# Patient Record
Sex: Female | Born: 1960 | Race: White | Hispanic: No | Smoking: Never smoker
Health system: Southern US, Community
[De-identification: ages and names within clinical notes are randomized; demographics above are authoritative.]

## PROBLEM LIST (undated history)

## (undated) DIAGNOSIS — M1990 Unspecified osteoarthritis, unspecified site: Secondary | ICD-10-CM

## (undated) DIAGNOSIS — N912 Amenorrhea, unspecified: Secondary | ICD-10-CM

## (undated) DIAGNOSIS — Z973 Presence of spectacles and contact lenses: Secondary | ICD-10-CM

## (undated) DIAGNOSIS — U071 COVID-19: Secondary | ICD-10-CM

## (undated) DIAGNOSIS — R87619 Unspecified abnormal cytological findings in specimens from cervix uteri: Secondary | ICD-10-CM

## (undated) DIAGNOSIS — Z87410 Personal history of cervical dysplasia: Secondary | ICD-10-CM

## (undated) DIAGNOSIS — Z87411 Personal history of vaginal dysplasia: Secondary | ICD-10-CM

## (undated) DIAGNOSIS — Z8742 Personal history of other diseases of the female genital tract: Secondary | ICD-10-CM

## (undated) HISTORY — PX: CERVICAL CONE BIOPSY: SUR198

## (undated) HISTORY — DX: Unspecified osteoarthritis, unspecified site: M19.90

## (undated) HISTORY — PX: GUM SURGERY: SHX658

## (undated) HISTORY — DX: Unspecified abnormal cytological findings in specimens from cervix uteri: R87.619

## (undated) HISTORY — DX: Amenorrhea, unspecified: N91.2

## (undated) HISTORY — PX: WISDOM TOOTH EXTRACTION: SHX21

---

## 1997-08-13 ENCOUNTER — Other Ambulatory Visit: Admission: RE | Admit: 1997-08-13 | Discharge: 1997-08-13 | Payer: Self-pay | Admitting: Obstetrics and Gynecology

## 1998-06-29 ENCOUNTER — Other Ambulatory Visit: Admission: RE | Admit: 1998-06-29 | Discharge: 1998-06-29 | Payer: Self-pay | Admitting: Obstetrics and Gynecology

## 1999-07-26 ENCOUNTER — Other Ambulatory Visit: Admission: RE | Admit: 1999-07-26 | Discharge: 1999-07-26 | Payer: Self-pay | Admitting: Obstetrics and Gynecology

## 2000-07-26 ENCOUNTER — Other Ambulatory Visit: Admission: RE | Admit: 2000-07-26 | Discharge: 2000-07-26 | Payer: Self-pay | Admitting: Obstetrics and Gynecology

## 2001-08-08 ENCOUNTER — Other Ambulatory Visit: Admission: RE | Admit: 2001-08-08 | Discharge: 2001-08-08 | Payer: Self-pay | Admitting: Obstetrics and Gynecology

## 2002-08-19 ENCOUNTER — Other Ambulatory Visit: Admission: RE | Admit: 2002-08-19 | Discharge: 2002-08-19 | Payer: Self-pay | Admitting: Obstetrics and Gynecology

## 2003-09-08 ENCOUNTER — Other Ambulatory Visit: Admission: RE | Admit: 2003-09-08 | Discharge: 2003-09-08 | Payer: Self-pay | Admitting: Obstetrics and Gynecology

## 2004-09-08 ENCOUNTER — Other Ambulatory Visit: Admission: RE | Admit: 2004-09-08 | Discharge: 2004-09-08 | Payer: Self-pay | Admitting: Obstetrics and Gynecology

## 2005-09-11 ENCOUNTER — Other Ambulatory Visit: Admission: RE | Admit: 2005-09-11 | Discharge: 2005-09-11 | Payer: Self-pay | Admitting: Obstetrics and Gynecology

## 2006-10-01 ENCOUNTER — Other Ambulatory Visit: Admission: RE | Admit: 2006-10-01 | Discharge: 2006-10-01 | Payer: Self-pay | Admitting: Obstetrics and Gynecology

## 2007-10-03 ENCOUNTER — Other Ambulatory Visit: Admission: RE | Admit: 2007-10-03 | Discharge: 2007-10-03 | Payer: Self-pay | Admitting: Obstetrics and Gynecology

## 2008-01-27 ENCOUNTER — Encounter: Admission: RE | Admit: 2008-01-27 | Discharge: 2008-01-27 | Payer: Self-pay | Admitting: Obstetrics and Gynecology

## 2009-01-08 DIAGNOSIS — M1990 Unspecified osteoarthritis, unspecified site: Secondary | ICD-10-CM | POA: Insufficient documentation

## 2009-01-27 ENCOUNTER — Encounter: Admission: RE | Admit: 2009-01-27 | Discharge: 2009-01-27 | Payer: Self-pay | Admitting: Obstetrics and Gynecology

## 2010-02-01 ENCOUNTER — Encounter: Admission: RE | Admit: 2010-02-01 | Discharge: 2010-02-01 | Payer: Self-pay | Admitting: Obstetrics and Gynecology

## 2011-01-04 ENCOUNTER — Other Ambulatory Visit: Payer: Self-pay | Admitting: Obstetrics and Gynecology

## 2011-01-04 DIAGNOSIS — Z1231 Encounter for screening mammogram for malignant neoplasm of breast: Secondary | ICD-10-CM

## 2011-02-03 ENCOUNTER — Ambulatory Visit
Admission: RE | Admit: 2011-02-03 | Discharge: 2011-02-03 | Disposition: A | Discharge status: Home / Self Care | Payer: BC Managed Care – PPO | Source: Ambulatory Visit | Attending: Obstetrics and Gynecology | Admitting: Obstetrics and Gynecology

## 2011-02-03 DIAGNOSIS — Z1231 Encounter for screening mammogram for malignant neoplasm of breast: Secondary | ICD-10-CM

## 2011-10-10 LAB — HM PAP SMEAR: HM Pap smear: NEGATIVE

## 2011-12-28 ENCOUNTER — Other Ambulatory Visit: Payer: Self-pay | Admitting: Obstetrics and Gynecology

## 2011-12-28 ENCOUNTER — Other Ambulatory Visit: Payer: Self-pay | Admitting: Obstetrics & Gynecology

## 2011-12-28 DIAGNOSIS — Z1231 Encounter for screening mammogram for malignant neoplasm of breast: Secondary | ICD-10-CM

## 2012-02-05 ENCOUNTER — Ambulatory Visit
Admission: RE | Admit: 2012-02-05 | Discharge: 2012-02-05 | Disposition: A | Discharge status: Home / Self Care | Payer: BC Managed Care – PPO | Source: Ambulatory Visit | Attending: Obstetrics & Gynecology | Admitting: Obstetrics & Gynecology

## 2012-02-05 DIAGNOSIS — Z1231 Encounter for screening mammogram for malignant neoplasm of breast: Secondary | ICD-10-CM

## 2012-10-08 ENCOUNTER — Encounter: Payer: Self-pay | Admitting: *Deleted

## 2012-10-11 ENCOUNTER — Encounter: Payer: Self-pay | Admitting: Nurse Practitioner

## 2012-10-11 ENCOUNTER — Ambulatory Visit (INDEPENDENT_AMBULATORY_CARE_PROVIDER_SITE_OTHER): Payer: PRIVATE HEALTH INSURANCE | Admitting: Nurse Practitioner

## 2012-10-11 VITALS — BP 120/62 | HR 74 | Resp 14 | Ht 69.5 in | Wt 147.2 lb

## 2012-10-11 DIAGNOSIS — Z01419 Encounter for gynecological examination (general) (routine) without abnormal findings: Secondary | ICD-10-CM

## 2012-10-11 MED ORDER — NORETHINDRONE 0.35 MG PO TABS: 1.0000 | ORAL_TABLET | Freq: Every day | ORAL | Status: DC

## 2012-10-11 NOTE — Progress Notes (Signed)
52 y.o. G0P0 Married Caucasian Fe here for annual exam.  Since on Micronor very light to no period. No bloating or PMS.  Patient's last menstrual period was 04/20/2012.          Sexually active: yes  The current method of family planning is POP (progesterone only).    Exercising: yes  aerobics and strength training  Smoker:  no  Health Maintenance: Pap:  Normal with negative HR HPV MMG:  01/2012 normal Colonoscopy:  Never (PCP is arranging for patient) TDaP:  2006 Labs: PCP maintains all labs and urine.    reports that she has never smoked. She has never used smokeless tobacco. She reports that she drinks about 3.5 ounces of alcohol per week. She reports that she does not use illicit drugs.  Past Medical History  Diagnosis Date  . Amenorrhea     History reviewed. No pertinent past surgical history.  Current Outpatient Prescriptions  Medication Sig Dispense Refill  . calcium carbonate 200 MG capsule Take 250 mg by mouth daily.      . Multiple Vitamin (MULTIVITAMIN) tablet Take 1 tablet by mouth daily.      . naproxen sodium (ANAPROX) 220 MG tablet Take 220 mg by mouth 2 (two) times daily with a meal.      . norethindrone (MICRONOR,CAMILA,ERRIN) 0.35 MG tablet Take 1 tablet by mouth daily.       No current facility-administered medications for this visit.    Family History  Problem Relation Age of Onset  . Anemia Mother   . Breast cancer Paternal Grandmother   . Emphysema Paternal Grandfather     ROS:  Pertinent items are noted in HPI.  Otherwise, a comprehensive ROS was negative.  Exam:   BP 120/62  Pulse 74  Resp 14  Ht 5' 9.5" (1.765 m)  Wt 147 lb 3.2 oz (66.769 kg)  BMI 21.43 kg/m2  LMP 04/20/2012 Height: 5' 9.5" (176.5 cm)  Ht Readings from Last 3 Encounters:  10/11/12 5' 9.5" (1.765 m)    General appearance: alert, cooperative and appears stated age Head: Normocephalic, without obvious abnormality, atraumatic Neck: no adenopathy, supple, symmetrical,  trachea midline and thyroid normal to inspection and palpation Lungs: clear to auscultation bilaterally Breasts: normal appearance, no masses or tenderness Heart: regular rate and rhythm Abdomen: soft, non-tender; no masses,  no organomegaly Extremities: extremities normal, atraumatic, no cyanosis or edema Skin: Skin color, texture, turgor normal. No rashes or lesions Lymph nodes: Cervical, supraclavicular, and axillary nodes normal. No abnormal inguinal nodes palpated Neurologic: Grossly normal   Pelvic: External genitalia:  no lesions              Urethra:  normal appearing urethra with no masses, tenderness or lesions              Bartholin's and Skene's: normal                 Vagina: normal appearing vagina with normal color and discharge, no lesions              Cervix: anteverted              Pap taken: no Bimanual Exam:  Uterus:  normal size, contour, position, consistency, mobility, non-tender              Adnexa: no mass, fullness, tenderness               Rectovaginal: Confirms  Anus:  normal sphincter tone, no lesions  A:  Well Woman with normal exam  POP for contraception  Micronor brand only  Melasma on OCP  P:   Pap smear as per guidelines   Mammogram due 11/14  counseled on breast self exam, adequate intake of calcium and vitamin D, diet and exercise return annually or prn  An After Visit Summary was printed and given to the patient.

## 2012-10-11 NOTE — Patient Instructions (Addendum)

## 2012-10-13 ENCOUNTER — Encounter: Payer: Self-pay | Admitting: Nurse Practitioner

## 2012-10-13 MED ORDER — NORETHINDRONE 0.35 MG PO TABS: 1.0000 | ORAL_TABLET | Freq: Every day | ORAL | Status: DC

## 2012-10-15 NOTE — Progress Notes (Signed)
Encounter reviewed by Dr. Brook Silva.  

## 2013-01-09 ENCOUNTER — Other Ambulatory Visit: Payer: Self-pay

## 2013-01-09 DIAGNOSIS — Z1231 Encounter for screening mammogram for malignant neoplasm of breast: Secondary | ICD-10-CM

## 2013-02-06 ENCOUNTER — Ambulatory Visit
Admission: RE | Admit: 2013-02-06 | Discharge: 2013-02-06 | Disposition: A | Discharge status: Home / Self Care | Payer: PRIVATE HEALTH INSURANCE | Source: Ambulatory Visit

## 2013-02-06 DIAGNOSIS — Z1231 Encounter for screening mammogram for malignant neoplasm of breast: Secondary | ICD-10-CM

## 2013-06-04 ENCOUNTER — Encounter: Payer: Self-pay | Admitting: Internal Medicine

## 2013-07-08 ENCOUNTER — Ambulatory Visit (AMBULATORY_SURGERY_CENTER): Payer: Self-pay

## 2013-07-08 VITALS — Ht 70.0 in | Wt 148.0 lb

## 2013-07-08 DIAGNOSIS — Z83719 Family history of colon polyps, unspecified: Secondary | ICD-10-CM

## 2013-07-08 DIAGNOSIS — Z8371 Family history of colonic polyps: Secondary | ICD-10-CM

## 2013-07-08 MED ORDER — MOVIPREP 100 G PO SOLR: ORAL | Status: DC

## 2013-07-08 NOTE — Progress Notes (Signed)
Per pt, no allergies to soy or egg products.Pt not taking any weight loss meds or using  O2 at home. 

## 2013-07-16 ENCOUNTER — Encounter: Payer: Self-pay | Admitting: Internal Medicine

## 2013-07-22 ENCOUNTER — Encounter: Payer: Self-pay | Admitting: Internal Medicine

## 2013-07-22 ENCOUNTER — Ambulatory Visit (AMBULATORY_SURGERY_CENTER): Payer: PRIVATE HEALTH INSURANCE | Admitting: Internal Medicine

## 2013-07-22 VITALS — BP 136/74 | HR 60 | Temp 98.1°F | Resp 16 | Ht 70.0 in | Wt 148.0 lb

## 2013-07-22 DIAGNOSIS — Z1211 Encounter for screening for malignant neoplasm of colon: Secondary | ICD-10-CM

## 2013-07-22 MED ORDER — SODIUM CHLORIDE 0.9 % IV SOLN: 500.0000 mL | INTRAVENOUS | Status: DC

## 2013-07-22 NOTE — Op Note (Signed)
[facility] [address] [place], [id number]   COLONOSCOPY PROCEDURE REPORT  PATIENT: Marguerite OleaSmith, Hind A.  MR#: [id number] BIRTHDATE: [birth year] , 53  yrs. old GENDER: Female ENDOSCOPIST: Roxy CedarJohn N Petrita Blunck Jr, MD REFERRED BY:.  [employer] PROCEDURE DATE:  07/22/2013 PROCEDURE:   Colonoscopy, screening First Screening Colonoscopy - Avg.  risk and is 50 yrs.  old or older Yes.  Prior Negative Screening - Now for repeat screening. N/A  History of Adenoma - Now for follow-up colonoscopy & has been > or = to 3 yrs.  N/A  Polyps Removed Today? No.  Recommend repeat exam, <10 yrs? No. ASA CLASS:   Class I INDICATIONS:average risk screening. MEDICATIONS: MAC sedation, administered by CRNA and propofol (Diprivan) 650mg  IV  DESCRIPTION OF PROCEDURE:   After the risks benefits and alternatives of the procedure were thoroughly explained, informed consent was obtained.  A digital rectal exam revealed no abnormalities of the rectum.   The LB [id number]  endoscope was introduced through the anus and advanced to the cecum, which was identified by both the appendix and ileocecal valve. No adverse events experienced.   The quality of the prep was adequate, using MoviPrep  The instrument was then slowly withdrawn as the colon was fully examined.  COLON FINDINGS: A normal appearing cecum, ileocecal valve, and appendiceal orifice were identified. There was dense seed material in the right colon which clogged the colonoscope requiring reintubation to the cecum with a new scope.  The ascending, hepatic flexure, transverse, splenic flexure, descending, sigmoid colon and rectum appeared unremarkable.  No polyps or cancers were seen. Retroflexed views revealed no abnormalities. The time to cecum=3 minutes 47 seconds.  Withdrawal time=23 minutes 59 seconds (total withdrawal time of exam with each colonoscope).  The scope was withdrawn and the procedure completed. COMPLICATIONS: There  were no complications.  ENDOSCOPIC IMPRESSION: 1. Normal colon  RECOMMENDATIONS: 1. Continue current colorectal screening recommendations for "routine risk" patients with a repeat colonoscopy in 10 years.   eSigned:  Roxy CedarJohn N Samera Macy Jr, MD 07/22/2013 12:21 PM   cc: Guerry Bruinichard Tisovec, MD and The Patient

## 2013-07-22 NOTE — Progress Notes (Signed)
Report to pacu RN, vss, bbs=clear 

## 2013-07-22 NOTE — Patient Instructions (Signed)
YOU HAD AN ENDOSCOPIC PROCEDURE TODAY AT THE [facility]: Refer to the procedure report that was given to you for any specific questions about what was found during the examination.  If the procedure report does not answer your questions, please call your gastroenterologist to clarify.  If you requested that your care partner not be given the details of your procedure findings, then the procedure report has been included in a sealed envelope for you to review at your convenience later.  YOU SHOULD EXPECT: Some feelings of bloating in the abdomen. Passage of more gas than usual.  Walking can help get rid of the air that was put into your GI tract during the procedure and reduce the bloating. If you had a lower endoscopy (such as a colonoscopy or flexible sigmoidoscopy) you may notice spotting of blood in your stool or on the toilet paper. If you underwent a bowel prep for your procedure, then you may not have a normal bowel movement for a few days.  DIET: Your first meal following the procedure should be a light meal and then it is ok to progress to your normal diet.  A half-sandwich or bowl of soup is an example of a good first meal.  Heavy or fried foods are harder to digest and may make you feel nauseous or bloated.  Likewise meals heavy in dairy and vegetables can cause extra gas to form and this can also increase the bloating.  Drink plenty of fluids but you should avoid alcoholic beverages for 24 hours.  ACTIVITY: Your care partner should take you home directly after the procedure.  You should plan to take it easy, moving slowly for the rest of the day.  You can resume normal activity the day after the procedure however you should NOT DRIVE or use heavy machinery for 24 hours (because of the sedation medicines used during the test).    SYMPTOMS TO REPORT IMMEDIATELY: A gastroenterologist can be reached at any hour.  During normal business hours, 8:30 AM to 5:00 PM Monday through Friday,  call [phone].  After hours and on weekends, please call the GI answering service at [phone] who will take a message and have the physician on call contact you.   Following lower endoscopy (colonoscopy or flexible sigmoidoscopy):  Excessive amounts of blood in the stool  Significant tenderness or worsening of abdominal pains  Swelling of the abdomen that is new, acute  Fever of 100F or higher    FOLLOW UP: If any biopsies were taken you will be contacted by phone or by letter within the next 1-3 weeks.  Call your gastroenterologist if you have not heard about the biopsies in 3 weeks.  Our staff will call the home number listed on your records the next business day following your procedure to check on you and address any questions or concerns that you may have at that time regarding the information given to you following your procedure. This is a courtesy call and so if there is no answer at the home number and we have not heard from you through the emergency physician on call, we will assume that you have returned to your regular daily activities without incident.  SIGNATURES/CONFIDENTIALITY: You and/or your care partner have signed paperwork which will be entered into your electronic medical record.  These signatures attest to the fact that that the information above on your After Visit Summary has been reviewed and is understood.  Full responsibility of the confidentiality   of this discharge information lies with you and/or your care-partner.     

## 2013-07-23 ENCOUNTER — Telehealth: Payer: Self-pay | Admitting: *Deleted

## 2013-07-23 NOTE — Telephone Encounter (Signed)
Name identifier, left message, follow-up 

## 2013-10-14 ENCOUNTER — Ambulatory Visit (INDEPENDENT_AMBULATORY_CARE_PROVIDER_SITE_OTHER): Payer: PRIVATE HEALTH INSURANCE | Admitting: Nurse Practitioner

## 2013-10-14 ENCOUNTER — Encounter: Payer: Self-pay | Admitting: Nurse Practitioner

## 2013-10-14 VITALS — BP 100/64 | HR 60 | Ht 69.75 in | Wt 150.0 lb

## 2013-10-14 DIAGNOSIS — Z01419 Encounter for gynecological examination (general) (routine) without abnormal findings: Secondary | ICD-10-CM

## 2013-10-14 MED ORDER — NORETHINDRONE 0.35 MG PO TABS: 1.0000 | ORAL_TABLET | Freq: Every day | ORAL | Status: DC

## 2013-10-14 NOTE — Progress Notes (Signed)
Patient ID: Mandy Davis, female   DOB: 08-09-1960, 53 y.o.   MRN: [id number] 53 y.o. G0P0 Married Caucasian Fe here for annual exam.  No menses since 12/12.  No increase in vagina dryness.  Some warm spells.  Patient's last menstrual period was 03/09/2011.          Sexually active: yes  The current method of family planning is POP (progesterone only).  Exercising: yes aerobics and strength training.  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    reports that she has never smoked. She has never used smokeless tobacco. She reports that she drinks about 3.5 ounces of alcohol per week. She reports that she does not use illicit drugs.  Past Medical History  Diagnosis Date  . Amenorrhea     Past Surgical History  Procedure Laterality Date  . Gum surgery    . Wisdom tooth extraction      Current Outpatient Prescriptions  Medication Sig Dispense Refill  . calcium carbonate 200 MG capsule Take 250 mg by mouth daily.      . Multiple Vitamin (MULTIVITAMIN) tablet Take 1 tablet by mouth daily.      . naproxen sodium (ANAPROX) 220 MG tablet Take 220 mg by mouth as needed.       . norethindrone (MICRONOR,CAMILA,ERRIN) 0.35 MG tablet Take 1 tablet (0.35 mg total) by mouth daily.  3 Package  3  . Omega-3 Fatty Acids (FISH OIL TRIPLE STRENGTH) 1400 MG CAPS Take 1 capsule by mouth daily.       No current facility-administered medications for this visit.    Family History  Problem Relation Age of Onset  . Anemia Mother   . Emphysema Paternal Grandfather   . Heart disease Father   . Colon polyps Maternal Grandfather     ROS:  Pertinent items are noted in HPI.  Otherwise, a comprehensive ROS was negative.  Exam:   BP 100/64  Pulse 60  Ht 5' 9.75" (1.772 m)  Wt 150 lb  (68.04 kg)  BMI 21.67 kg/m2  LMP 03/09/2011 Height: 5' 9.75" (177.2 cm)  Ht Readings from Last 3 Encounters:  10/14/13 5' 9.75" (1.772 m)  07/22/13 5\' 10"  (1.778 m)  07/08/13 5\' 10"  (1.778 m)    General appearance: alert, cooperative and appears stated age Head: Normocephalic, without obvious abnormality, atraumatic Neck: no adenopathy, supple, symmetrical, trachea midline and thyroid normal to inspection and palpation Lungs: clear to auscultation bilaterally Breasts: normal appearance, no masses or tenderness Heart: regular rate and rhythm Abdomen: soft, non-tender; no masses,  no organomegaly Extremities: extremities normal, atraumatic, no cyanosis or edema Skin: Skin color, texture, turgor normal. No rashes or lesions Lymph nodes: Cervical, supraclavicular, and axillary nodes normal. No abnormal inguinal nodes palpated Neurologic: Grossly normal   Pelvic: External genitalia:  no lesions              Urethra:  normal appearing urethra with no masses, tenderness or lesions              Bartholin's and Skene's: normal                 Vagina: normal appearing vagina with normal color and discharge, no lesions  Cervix: anteverted              Pap taken: No. Bimanual Exam:  Uterus:  normal size, contour, position, consistency, mobility, non-tender              Adnexa: no mass, fullness, tenderness               Rectovaginal: Confirms               Anus:  normal sphincter tone, no lesions  A:  Well Woman with normal exam  POP for contraception  Slight vaso symptoms  P:   Reviewed health and wellness pertinent to exam  Pap smear not taken today  Mammogram is due 01/2014  Refill POP for another year  Counseled on breast self exam, mammography screening, adequate intake of calcium and vitamin D, diet and exercise return annually or prn  An After Visit Summary was printed and given to the patient.

## 2013-10-14 NOTE — Patient Instructions (Signed)

## 2013-10-16 NOTE — Progress Notes (Signed)
Encounter reviewed by Dr. Brook Silva.  

## 2014-01-06 ENCOUNTER — Other Ambulatory Visit: Payer: Self-pay

## 2014-01-06 DIAGNOSIS — Z1231 Encounter for screening mammogram for malignant neoplasm of breast: Secondary | ICD-10-CM

## 2014-02-09 ENCOUNTER — Ambulatory Visit: Payer: PRIVATE HEALTH INSURANCE

## 2014-02-24 ENCOUNTER — Ambulatory Visit
Admission: RE | Admit: 2014-02-24 | Discharge: 2014-02-24 | Disposition: A | Discharge status: Home / Self Care | Payer: PRIVATE HEALTH INSURANCE | Source: Ambulatory Visit

## 2014-02-24 DIAGNOSIS — Z1231 Encounter for screening mammogram for malignant neoplasm of breast: Secondary | ICD-10-CM

## 2014-09-14 ENCOUNTER — Other Ambulatory Visit: Payer: Self-pay | Admitting: Nurse Practitioner

## 2014-09-14 NOTE — Telephone Encounter (Signed)
Medication refill request: norethindrone Last AEX:  10-14-13  Next AEX: 10-16-14  Last MMG (if hormonal medication request): 02-24-14 WNL  Refill authorized: please advise

## 2014-09-14 NOTE — Telephone Encounter (Signed)
Patient is now scheduled for 11/17/14 for her AEX. She also called to request refills to last until then.

## 2014-10-16 ENCOUNTER — Ambulatory Visit: Payer: PRIVATE HEALTH INSURANCE | Admitting: Nurse Practitioner

## 2014-10-23 ENCOUNTER — Ambulatory Visit: Payer: PRIVATE HEALTH INSURANCE | Admitting: Nurse Practitioner

## 2014-11-17 ENCOUNTER — Ambulatory Visit (INDEPENDENT_AMBULATORY_CARE_PROVIDER_SITE_OTHER): Payer: PRIVATE HEALTH INSURANCE | Admitting: Nurse Practitioner

## 2014-11-17 ENCOUNTER — Encounter: Payer: Self-pay | Admitting: Nurse Practitioner

## 2014-11-17 VITALS — BP 102/68 | HR 68 | Resp 14 | Ht 69.75 in | Wt 151.0 lb

## 2014-11-17 DIAGNOSIS — Z01419 Encounter for gynecological examination (general) (routine) without abnormal findings: Secondary | ICD-10-CM

## 2014-11-17 DIAGNOSIS — Z0000 Encounter for general adult medical examination without abnormal findings: Secondary | ICD-10-CM | POA: Diagnosis not present

## 2014-11-17 MED ORDER — NORETHINDRONE 0.35 MG PO TABS: ORAL_TABLET | ORAL | Status: DC

## 2014-11-17 NOTE — Patient Instructions (Signed)

## 2014-11-17 NOTE — Progress Notes (Signed)
54 y.o. G0P0 Widowed  Caucasian Fe here for annual exam.  Husband passed 10/26/2013.  She has been working a lot to make up for the loss.   No new health problems. Some vaso symptoms that are mild and tolerable.  Patient's last menstrual period was 03/09/2011.          Sexually active: No.  The current method of family planning is OCP (estrogen/progesterone).   Exercising: Yes.    aerobic, strength training, walking Smoker:  no  Health Maintenance: Pap:  09/2011 normal with negative HR HPV MMG:  02/24/14 3D Dense, Category c, Bi-rads Category 1 negative Colonoscopy: 07/22/13, normal, repeat in 10 years TDaP:  01/18/2014 Labs: PCP   UA: PCP   reports that she has never smoked. She has never used smokeless tobacco. She reports that she drinks about 9.0 oz of alcohol per week. She reports that she does not use illicit drugs.  Past Medical History  Diagnosis Date  . Amenorrhea     Past Surgical History  Procedure Laterality Date  . Gum surgery    . Wisdom tooth extraction      Current Outpatient Prescriptions  Medication Sig Dispense Refill  . Multiple Vitamin (MULTIVITAMIN) tablet Take 1 tablet by mouth daily.    . naproxen sodium (ANAPROX) 220 MG tablet Take 220 mg by mouth as needed.     . norethindrone (MICRONOR,CAMILA,ERRIN) 0.35 MG tablet TAKE 1 TABLET (0.35 MG TOTAL) BY MOUTH DAILY. 84 tablet 0  . calcium carbonate 200 MG capsule Take 250 mg by mouth daily.    . Omega-3 Fatty Acids (FISH OIL TRIPLE STRENGTH) 1400 MG CAPS Take 1 capsule by mouth daily.     No current facility-administered medications for this visit.    Family History  Problem Relation Age of Onset  . Anemia Mother   . Emphysema Paternal Grandfather   . Heart disease Father   . Colon polyps Maternal Grandfather   . Cancer Maternal Aunt     ROS:  Pertinent items are noted in HPI.  Otherwise, a comprehensive ROS was negative.  Exam:   BP 102/68 mmHg  Pulse 68  Resp 14  Ht 5' 9.75" (1.772 m)  Wt 151 lb  (68.493 kg)  BMI 21.81 kg/m2  LMP 03/09/2011 Height: 5' 9.75" (177.2 cm) Ht Readings from Last 3 Encounters:  11/17/14 5' 9.75" (1.772 m)  10/14/13 5' 9.75" (1.772 m)  07/22/13  (1.778 m)    General appearance: alert, cooperative and appears stated age Head: Normocephalic, without obvious abnormality, atraumatic Neck: no adenopathy, supple, symmetrical, trachea midline and thyroid normal to inspection and palpation Lungs: clear to auscultation bilaterally Breasts: normal appearance, no masses or tenderness Heart: regular rate and rhythm Abdomen: soft, non-tender; no masses,  no organomegaly Extremities: extremities normal, atraumatic, no cyanosis or edema Skin: Skin color, texture, turgor normal. No rashes or lesions Lymph nodes: Cervical, supraclavicular, and axillary nodes normal. No abnormal inguinal nodes palpated Neurologic: Grossly normal   Pelvic: External genitalia:  no lesions              Urethra:  normal appearing urethra with no masses, tenderness or lesions              Bartholin's and Skene's: normal                 Vagina: normal appearing vagina with normal color and discharge, no lesions              Cervix: anteverted  Pap taken: Yes.   Bimanual Exam:  Uterus:  normal size, contour, position, consistency, mobility, non-tender              Adnexa: no mass, fullness, tenderness               Rectovaginal: Confirms               Anus:  normal sphincter tone, no lesions  Chaperone present: yes  A:  Well Woman with normal exam  POP for cycle regulation Slight vaso symptoms - tolerable   P:   Reviewed health and wellness pertinent to exam  Pap smear as above  Mammogram is due 12/16  Refill on POP for another year - then plans are to stop med's at that time.  Counseled on breast self exam, mammography screening, adequate intake of calcium and vitamin D, diet and exercise, Kegel's exercises return annually or prn  An After Visit  Summary was printed and given to the patient.

## 2014-11-18 LAB — IPS PAP TEST WITH HPV

## 2014-11-18 NOTE — Progress Notes (Signed)
Encounter reviewed by Dr. Brook Amundson C. Silva.  

## 2014-12-08 ENCOUNTER — Other Ambulatory Visit: Payer: Self-pay | Admitting: Nurse Practitioner

## 2015-01-19 ENCOUNTER — Other Ambulatory Visit: Payer: Self-pay

## 2015-01-19 DIAGNOSIS — Z1231 Encounter for screening mammogram for malignant neoplasm of breast: Secondary | ICD-10-CM

## 2015-03-01 ENCOUNTER — Ambulatory Visit
Admission: RE | Admit: 2015-03-01 | Discharge: 2015-03-01 | Disposition: A | Discharge status: Home / Self Care | Payer: No Typology Code available for payment source | Source: Ambulatory Visit

## 2015-03-01 DIAGNOSIS — Z1231 Encounter for screening mammogram for malignant neoplasm of breast: Secondary | ICD-10-CM

## 2015-03-24 ENCOUNTER — Encounter: Payer: Self-pay | Admitting: Family Medicine

## 2015-03-24 ENCOUNTER — Ambulatory Visit (INDEPENDENT_AMBULATORY_CARE_PROVIDER_SITE_OTHER): Payer: No Typology Code available for payment source | Admitting: Family Medicine

## 2015-03-24 VITALS — BP 132/84 | Ht 69.0 in | Wt 155.0 lb

## 2015-03-24 DIAGNOSIS — M7122 Synovial cyst of popliteal space [Baker], left knee: Secondary | ICD-10-CM

## 2015-03-24 DIAGNOSIS — M7120 Synovial cyst of popliteal space [Baker], unspecified knee: Secondary | ICD-10-CM | POA: Insufficient documentation

## 2015-03-24 MED ORDER — METHYLPREDNISOLONE ACETATE 40 MG/ML IJ SUSP
40.0000 mg | Freq: Once | INTRAMUSCULAR | Status: AC
  Administered 2015-03-24: 40 mg via INTRA_ARTICULAR

## 2015-03-24 NOTE — Progress Notes (Signed)
  Mandy OleaLauren A Hartfield - 55 y.o. female MRN [id number]  Date of birth: 28-May-1960  SUBJECTIVE:  Including CC & ROS.  Mandy Davis is a 55 y.o. female who presents today for left knee pain.    Knee Pain left, initial visit - patient presents today for ongoing left knee pain. This been ongoing now for 6 months and she denies a specific injury. She has had swelling in the back of her knee and occasional fullness feeling. [facility] which was not revealing for a meniscal tear. She is here for possible intervention in second opinion.  PMHx - Updated and reviewed.  Contributory factors include: Noncontributory PSHx - Updated and reviewed.  Contributory factors include:  Noncontributory FHx - Updated and reviewed.  Contributory factors include:  Noncontributory Medications - updated and reviewed   12 point ROS negative other than per HPI.   Exam:  Filed Vitals:   03/24/15 1354  BP: 132/84    Gen: NAD, AAO 3 Cardiorespiratory - Normal respiratory effort/rate.  RRR Skin: No rashes or erythema Extremities: No edema, pulses +2 bilateral upper and lower extremity   L Knee:  Normal to inspection with no erythema or effusion or obvious bony abnormalities.  L knee with + Baker's cysts Palpation normal with no warmth or joint line tenderness or patellar tenderness or condyle tenderness.  No TTP along infrapatellar or pes anserine bursas.   ROM normal in flexion (135 degrees) and extension (0 degrees) and lower leg rotation. Ligaments with solid consistent endpoints including ACL, PCL, LCL, MCL.  Negative Anterior Drawer/Lachman/Pivot Shift Negative Mcmurray's and provocative meniscal tests including Thessaly and Apley compression testing   Patellar and quadriceps tendons unremarkable. Hamstring and quadriceps strength is normal.  Neurovascularly intact B/L LE

## 2015-03-24 NOTE — Assessment & Plan Note (Addendum)
Per her report her MRI was basically normal except for a Baker cyst. We'll try aspiration and injection but would recommend further evaluation including reviewing the read of the MRI if this does not work. Definite fixation is arthroscopic surgery.   Aspiration/Injection Procedure Note Mandy Davis 1960/05/24  Procedure: Aspiration and Injection Indications: Baker's cyst L   Procedure Details Consent: Risks of procedure as well as the alternatives and risks of each were explained to the (patient/caregiver).  Consent for procedure obtained. Time Out: Verified patient identification, verified procedure, site/side was marked, verified correct patient position, special equipment/implants available, medications/allergies/relevent history reviewed, required imaging and test results available.  Performed.  The area was cleaned with iodine and alcohol swabs.    The L posterior popliteal area was aspirated for a baker's cyst using 18 cc gauge needle w/ 20 cc syringe after 3cc injection of lidocaine into the area. using 1 cc's of 40mg  Depomedrol.  Ultrasound wast used. Images were obtained in Transverse and Long views showing the injection.    Amount of Fluid Aspirated: 10mL Character of Fluid: straw colored Fluid was sent ONG:EXBMfor:None A sterile dressing was applied.  Patient did tolerate procedure well. Estimated blood loss: None

## 2015-11-18 ENCOUNTER — Encounter: Payer: Self-pay | Admitting: Nurse Practitioner

## 2015-11-18 NOTE — Progress Notes (Signed)
Patient ID: Mandy Davis, female   DOB: 07/08/1960, 55 y.o.   MRN: [id number]  55 y.o. G0P0000 Widowed  Caucasian Fe here for annual exam.  Her girl friend convinced her to go on a trip to [place] X 1 week.  On POP for cycle regulation.  Patient's last menstrual period was 03/09/2011 (exact date).          Sexually active: No. The current method of family planning is oral progesterone-only contraceptive.    Exercising: Yes.  Teaches aerobics, strength training, walking. At least 3 hours of exercise per week Smoker:  no  Health Maintenance: Pap: 11/17/14, Negative with negative HR HPV  MMG: 03/01/15, 3D, Bi-Rads 1: Negative  Colonoscopy: 07/22/13, normal, repeat in 10 years TDaP: 03/20/13 Hep C & HIV: done  today Labs: PCP takes care of all labs  Urine:negative   reports that she has never smoked. She has never used smokeless tobacco. She reports that she drinks about 9.0 oz of alcohol per week . She reports that she does not use drugs.  Past Medical History:  Diagnosis Date  . Amenorrhea     Past Surgical History:  Procedure Laterality Date  . GUM SURGERY    . WISDOM TOOTH EXTRACTION      Current Outpatient Prescriptions  Medication Sig Dispense Refill  . Multiple Vitamin (MULTIVITAMIN) tablet Take 1 tablet by mouth daily.    . naproxen sodium (ANAPROX) 220 MG tablet Take 220 mg by mouth as needed.     . norethindrone (MICRONOR,CAMILA,ERRIN) 0.35 MG tablet TAKE 1 TABLET (0.35 MG TOTAL) BY MOUTH DAILY. 84 tablet 4   No current facility-administered medications for this visit.     Family History  Problem Relation Age of Onset  . Anemia Mother   . Heart disease Father   . Colon polyps Maternal Grandfather   . Cancer Maternal Aunt   . Emphysema Paternal Grandfather     ROS:  Pertinent items are noted in HPI.  Otherwise, a comprehensive ROS was negative.  Exam:   BP 120/76 (BP Location: Right Arm, Patient Position: Sitting, Cuff Size: Normal)   Pulse 72   Ht 5' 9.25"  (1.759 m)   Wt 154 lb (69.9 kg)   LMP 03/09/2011 (Exact Date)   BMI 22.58 kg/m  Height: 5' 9.25" (175.9 cm) Ht Readings from Last 3 Encounters:  11/19/15 5' 9.25" (1.759 m)  03/24/15 5\' 9"  (1.753 m)  11/17/14 5' 9.75" (1.772 m)    General appearance: alert, cooperative and appears stated age Head: Normocephalic, without obvious abnormality, atraumatic Neck: no adenopathy, supple, symmetrical, trachea midline and thyroid normal to inspection and palpation Lungs: clear to auscultation bilaterally Breasts: normal appearance, no masses or tenderness Heart: regular rate and rhythm Abdomen: soft, non-tender; no masses,  no organomegaly Extremities: extremities normal, atraumatic, no cyanosis or edema Skin: Skin color, texture, turgor normal. No rashes or lesions Lymph nodes: Cervical, supraclavicular, and axillary nodes normal. No abnormal inguinal nodes palpated Neurologic: Grossly normal   Pelvic: External genitalia:  no lesions              Urethra:  normal appearing urethra with no masses, tenderness or lesions              Bartholin's and Skene's: normal                 Vagina: normal appearing vagina with normal color and discharge, no lesions  Cervix: anteverted              Pap taken: No. Bimanual Exam:  Uterus:  normal size, contour, position, consistency, mobility, non-tender              Adnexa: no mass, fullness, tenderness               Rectovaginal: Confirms               Anus:  normal sphincter tone, no lesions  Chaperone present: yes  A:  Well Woman with normal exam             POP for cycle regulation - may consider stopping if FSH is menopausal Slight vaso symptoms - tolerable   P:   Reviewed health and wellness pertinent to exam  Pap smear as above  Mammogram is due 12/17  Will follow with LABS  Counseled on breast self exam, mammography screening, adequate intake of calcium and vitamin D, diet and exercise return annually or  prn  An After Visit Summary was printed and given to the patient.

## 2015-11-19 ENCOUNTER — Ambulatory Visit (INDEPENDENT_AMBULATORY_CARE_PROVIDER_SITE_OTHER): Payer: PRIVATE HEALTH INSURANCE | Admitting: Nurse Practitioner

## 2015-11-19 ENCOUNTER — Encounter: Payer: Self-pay | Admitting: Nurse Practitioner

## 2015-11-19 VITALS — BP 120/76 | HR 72 | Ht 69.25 in | Wt 154.0 lb

## 2015-11-19 DIAGNOSIS — N951 Menopausal and female climacteric states: Secondary | ICD-10-CM

## 2015-11-19 DIAGNOSIS — Z0000 Encounter for general adult medical examination without abnormal findings: Secondary | ICD-10-CM

## 2015-11-19 DIAGNOSIS — Z01419 Encounter for gynecological examination (general) (routine) without abnormal findings: Secondary | ICD-10-CM

## 2015-11-19 LAB — POCT URINALYSIS DIPSTICK
BILIRUBIN UA: NEGATIVE
Blood, UA: NEGATIVE
GLUCOSE UA: NEGATIVE
KETONES UA: NEGATIVE
LEUKOCYTES UA: NEGATIVE
Nitrite, UA: NEGATIVE
Protein, UA: NEGATIVE
Urobilinogen, UA: NEGATIVE
pH, UA: 5

## 2015-11-19 LAB — HIV ANTIBODY (ROUTINE TESTING W REFLEX): HIV: NONREACTIVE

## 2015-11-19 LAB — HEPATITIS C ANTIBODY: HCV AB: NEGATIVE

## 2015-11-19 MED ORDER — NORETHINDRONE 0.35 MG PO TABS: ORAL_TABLET | ORAL | 4 refills | Status: DC

## 2015-11-19 NOTE — Progress Notes (Signed)
Reviewed personally.  M. Suzanne Khang Hannum, MD.  

## 2015-11-19 NOTE — Patient Instructions (Signed)

## 2015-11-20 LAB — TSH: TSH: 0.82 mIU/L

## 2015-11-20 LAB — FOLLICLE STIMULATING HORMONE: FSH: 107.7 m[IU]/mL

## 2015-11-25 NOTE — Addendum Note (Signed)
Addended by: Luisa DagoPHILLIPS, Anneta Rounds C on: 11/25/2015 08:48 AM   Modules accepted: Orders

## 2016-01-21 ENCOUNTER — Other Ambulatory Visit: Payer: Self-pay | Admitting: Nurse Practitioner

## 2016-01-21 NOTE — Telephone Encounter (Signed)
Medication refill request: Micronor  Last AEX:  11/19/15 PG Next AEX: 11/22/16 PG Last MMG (if hormonal medication request): 03/02/15 BIRADS1:neg  Refill authorized: 11/19/15 #[id number]. To [facility].  Notes Recorded by Ria CommentPatricia Grubb, FNP on 11/21/2015 at 7:14 PM EDT Please let pt know that Hep C and HIV were negative as expected. The TSH is normal. The FSH does show that she is menopausal and does not need to take POP any longer. She may stop her pills now. Does not need the refill.

## 2016-01-24 ENCOUNTER — Other Ambulatory Visit: Payer: Self-pay | Admitting: Nurse Practitioner

## 2016-01-24 DIAGNOSIS — Z1231 Encounter for screening mammogram for malignant neoplasm of breast: Secondary | ICD-10-CM

## 2016-03-01 ENCOUNTER — Ambulatory Visit
Admission: RE | Admit: 2016-03-01 | Discharge: 2016-03-01 | Disposition: A | Discharge status: Home / Self Care | Payer: No Typology Code available for payment source | Source: Ambulatory Visit | Attending: Nurse Practitioner | Admitting: Nurse Practitioner

## 2016-03-01 DIAGNOSIS — Z1231 Encounter for screening mammogram for malignant neoplasm of breast: Secondary | ICD-10-CM

## 2016-11-22 ENCOUNTER — Encounter: Payer: Self-pay | Admitting: Certified Nurse Midwife

## 2016-11-22 ENCOUNTER — Ambulatory Visit: Payer: PRIVATE HEALTH INSURANCE | Admitting: Nurse Practitioner

## 2016-11-22 ENCOUNTER — Ambulatory Visit (INDEPENDENT_AMBULATORY_CARE_PROVIDER_SITE_OTHER): Payer: PRIVATE HEALTH INSURANCE | Admitting: Certified Nurse Midwife

## 2016-11-22 VITALS — BP 116/68 | HR 64 | Resp 16 | Ht 69.5 in | Wt 151.0 lb

## 2016-11-22 DIAGNOSIS — N951 Menopausal and female climacteric states: Secondary | ICD-10-CM | POA: Diagnosis not present

## 2016-11-22 DIAGNOSIS — Z01419 Encounter for gynecological examination (general) (routine) without abnormal findings: Secondary | ICD-10-CM

## 2016-11-22 NOTE — Progress Notes (Signed)
56 y.o. G0P0000 Widowed  Caucasian Fe here for annual exam. Menopausal no vaginal bleeding or vaginal dryness.  Sees PCP Dr. Wylene Simmer every other year with labs. She will be seen in 12/18 for aex. Declines labs today. Not sexually active. No health issues today.    No LMP recorded. Patient is not currently having periods (Reason: Perimenopausal).          Sexually active: No.  The current method of family planning is abstinence.    Exercising: Yes.    aerobics, weights, and walking Smoker:  no  Health Maintenance: Pap:  11/17/14, Negative with negative HR HPV  History of Abnormal Pap: yes, years ago per patient MMG:  03/01/16 BIRADS 1 negative/density d 3 D yearly Self Breast exams: occasional Colonoscopy:  07/22/13, normal, repeat in 10 years BMD:   none TDaP:  03/20/13 Shingles: none Pneumonia: none Hep C and HIV: 11/19/15 Negative Labs: PCP   reports that she has never smoked. She has never used smokeless tobacco. She reports that she drinks about 9.0 oz of alcohol per week . She reports that she does not use drugs.  Past Medical History:  Diagnosis Date  . Amenorrhea     Past Surgical History:  Procedure Laterality Date  . GUM SURGERY    . WISDOM TOOTH EXTRACTION      Current Outpatient Prescriptions  Medication Sig Dispense Refill  . Multiple Vitamin (MULTIVITAMIN) tablet Take 1 tablet by mouth daily.    . naproxen sodium (ANAPROX) 220 MG tablet Take 220 mg by mouth as needed.      No current facility-administered medications for this visit.     Family History  Problem Relation Age of Onset  . Anemia Mother   . Heart disease Father   . Colon polyps Maternal Grandfather   . Cancer Maternal Aunt   . Emphysema Paternal Grandfather     ROS:  Pertinent items are noted in HPI.  Otherwise, a comprehensive ROS was negative.  Exam:   BP 116/68 (BP Location: Right Arm, Patient Position: Sitting, Cuff Size: Normal)   Pulse 64   Resp 16   Ht 5' 9.5" (1.765 m)   Wt 151 lb  (68.5 kg)   BMI 21.98 kg/m  Height: 5' 9.5" (176.5 cm) Ht Readings from Last 3 Encounters:  11/22/16 5' 9.5" (1.765 m)  11/19/15 5' 9.25" (1.759 m)  03/24/15 5\' 9"  (1.753 m)    General appearance: alert, cooperative and appears stated age Head: Normocephalic, without obvious abnormality, atraumatic Neck: no adenopathy, supple, symmetrical, trachea midline and thyroid normal to inspection and palpation Lungs: clear to auscultation bilaterally Breasts: normal appearance, no masses or tenderness, No nipple retraction or dimpling, No nipple discharge or bleeding, No axillary or supraclavicular adenopathy Heart: regular rate and rhythm Abdomen: soft, non-tender; no masses,  no organomegaly Extremities: extremities normal, atraumatic, no cyanosis or edema Skin: Skin color, texture, turgor normal. No rashes or lesions Lymph nodes: Cervical, supraclavicular, and axillary nodes normal. No abnormal inguinal nodes palpated Neurologic: Grossly normal   Pelvic: External genitalia:  no lesions              Urethra:  normal appearing urethra with no masses, tenderness or lesions              Bartholin's and Skene's: normal                 Vagina: normal appearing vagina with normal color and discharge, no lesions  Cervix: no cervical motion tenderness, no lesions and nulliparous appearance              Pap taken: No. Bimanual Exam:  Uterus:  normal size, contour, position, consistency, mobility, non-tender              Adnexa: normal adnexa and no mass, fullness, tenderness               Rectovaginal: Confirms               Anus:  normal sphincter tone, no lesions  Chaperone present: yes  A:  Well Woman with normal exam  Menopausal no HRT  Vaginal dryness    P:   Reviewed health and wellness pertinent to exam  Aware of need to advise if vaginal bleeding  Discussed vaginal findings and coconut oil option for treatment. Instructions given. Questions addressed.  Pap smear:  no  counseled on breast self exam, mammography screening, feminine hygiene, adequate intake of calcium and vitamin D, diet and exercise  return annually or prn  An After Visit Summary was printed and given to the patient.

## 2016-11-22 NOTE — Patient Instructions (Signed)

## 2017-01-19 ENCOUNTER — Other Ambulatory Visit: Payer: Self-pay | Admitting: Certified Nurse Midwife

## 2017-01-19 DIAGNOSIS — Z1231 Encounter for screening mammogram for malignant neoplasm of breast: Secondary | ICD-10-CM

## 2017-03-02 ENCOUNTER — Ambulatory Visit
Admission: RE | Admit: 2017-03-02 | Discharge: 2017-03-02 | Disposition: A | Discharge status: Home / Self Care | Payer: No Typology Code available for payment source | Source: Ambulatory Visit | Attending: Certified Nurse Midwife | Admitting: Certified Nurse Midwife

## 2017-03-02 DIAGNOSIS — Z1231 Encounter for screening mammogram for malignant neoplasm of breast: Secondary | ICD-10-CM

## 2017-11-23 ENCOUNTER — Ambulatory Visit: Payer: PRIVATE HEALTH INSURANCE | Admitting: Certified Nurse Midwife

## 2017-12-05 ENCOUNTER — Other Ambulatory Visit: Payer: Self-pay

## 2017-12-05 ENCOUNTER — Ambulatory Visit (INDEPENDENT_AMBULATORY_CARE_PROVIDER_SITE_OTHER): Payer: PRIVATE HEALTH INSURANCE | Admitting: Certified Nurse Midwife

## 2017-12-05 ENCOUNTER — Encounter: Payer: Self-pay | Admitting: Certified Nurse Midwife

## 2017-12-05 VITALS — BP 108/62 | HR 70 | Resp 16 | Ht 69.75 in | Wt 151.0 lb

## 2017-12-05 DIAGNOSIS — N951 Menopausal and female climacteric states: Secondary | ICD-10-CM | POA: Diagnosis not present

## 2017-12-05 DIAGNOSIS — Z124 Encounter for screening for malignant neoplasm of cervix: Secondary | ICD-10-CM

## 2017-12-05 DIAGNOSIS — Z01419 Encounter for gynecological examination (general) (routine) without abnormal findings: Secondary | ICD-10-CM | POA: Diagnosis not present

## 2017-12-05 DIAGNOSIS — K130 Diseases of lips: Secondary | ICD-10-CM | POA: Diagnosis not present

## 2017-12-05 NOTE — Patient Instructions (Signed)

## 2017-12-05 NOTE — Progress Notes (Addendum)
57 y.o. G0P0000 Widowed  Caucasian Fe here for annual exam. Menopausal no HRT. Denies vaginal bleeding or vaginal dryness. Sees Dr. Sande Rives yearly for labs and aex, due in 12/19. Declines labs today. Having lips of mouth irritation frequently, treating with hydrocortisone and monistat, which is working. Chronic issues, no other areas of concern today.   No LMP recorded. (Menstrual status: Perimenopausal).          Sexually active: No.  The current method of family planning is post menopausal status.    Exercising: Yes.    aerobics & walking Smoker:  no  Review of Systems  Constitutional: Negative.   HENT: Negative.   Eyes: Negative.   Respiratory: Negative.   Cardiovascular: Negative.   Gastrointestinal: Negative.   Genitourinary: Negative.   Musculoskeletal: Negative.   Skin: Positive for rash.  Neurological: Negative.   Endo/Heme/Allergies: Negative.   Psychiatric/Behavioral: Negative.     Health Maintenance: Pap:  11-17-14 neg HPV HR neg History of Abnormal Pap: yes, cone biopsy over 73yrs ago MMG:  03-02-17 category d density birads 1:neg Self Breast exams: yes Colonoscopy:  07-22-13 f/u 91yrs BMD:   none TDaP:  2015 Shingles: 2018, 2019 Pneumonia: no Hep C and HIV: both neg 2017 Labs: if needed   reports that she has never smoked. She has never used smokeless tobacco. She reports that she drinks about 15.0 standard drinks of alcohol per week. She reports that she does not use drugs.  Past Medical History:  Diagnosis Date  . Amenorrhea     Past Surgical History:  Procedure Laterality Date  . GUM SURGERY    . WISDOM TOOTH EXTRACTION      Current Outpatient Medications  Medication Sig Dispense Refill  . Multiple Vitamin (MULTIVITAMIN) tablet Take 1 tablet by mouth daily.    . naproxen sodium (ANAPROX) 220 MG tablet Take 220 mg by mouth as needed.      No current facility-administered medications for this visit.     Family History  Problem Relation Age of  Onset  . Anemia Mother   . Heart disease Father   . Colon polyps Maternal Grandfather   . Cancer Maternal Aunt   . Emphysema Paternal Grandfather     ROS:  Pertinent items are noted in HPI.  Otherwise, a comprehensive ROS was negative.  Exam:   There were no vitals taken for this visit.   Ht Readings from Last 3 Encounters:  11/22/16 5' 9.5" (1.765 m)  11/19/15 5' 9.25" (1.759 m)  03/24/15 5\' 9"  (1.753 m)    General appearance: alert, cooperative and appears stated age Head: Normocephalic, without obvious abnormality, atraumatic Mouth; Lipstick present, no scaling noted, oral cavity, no change in color of mucous membrane or ulcers noted Neck: no adenopathy, supple, symmetrical, trachea midline and thyroid normal to inspection and palpation Lungs: clear to auscultation bilaterally Breasts: normal appearance, no masses or tenderness, No nipple retraction or dimpling, No nipple discharge or bleeding, No axillary or supraclavicular adenopathy Heart: regular rate and rhythm Abdomen: soft, non-tender; no masses,  no organomegaly Extremities: extremities normal, atraumatic, no cyanosis or edema Skin: Skin color, texture, turgor normal. No rashes or lesions Lymph nodes: Cervical, supraclavicular, and axillary nodes normal. No abnormal inguinal nodes palpated Neurologic: Grossly normal   Pelvic: External genitalia:  no lesions, normal female              Urethra:  normal appearing urethra with no masses, tenderness or lesions  Bartholin's and Skene's: normal                 Vagina: normal appearing vagina with normal color and discharge, no lesions              Cervix: no cervical motion tenderness, no lesions and normal appearance              Pap taken: No. Bimanual Exam:  Uterus:  normal size, contour, position, consistency, mobility, non-tender and anteflexed              Adnexa: normal adnexa and no mass, fullness, tenderness               Rectovaginal: Confirms                Anus:  normal sphincter tone, no lesions  Chaperone present: yes  A:  Well Woman with normal exam  Menopausal no HRT  Chapped lips chronic or allergy related?  P:   Reviewed health and wellness pertinent to exam  Aware of need to advise if vaginal bleeding.  Discussed follow up with dermatology or PCP regarding lip issue. Discouraged long term hydrocortisone use. Patient will see dermatology if conitnues.  Pap smear: yes  counseled on breast self exam, mammography screening, feminine hygiene, adequate intake of calcium and vitamin D, diet and exercise  return annually or prn  An After Visit Summary was printed and given to the patient.

## 2017-12-06 ENCOUNTER — Other Ambulatory Visit (HOSPITAL_COMMUNITY)
Admission: RE | Admit: 2017-12-06 | Discharge: 2017-12-06 | Disposition: A | Discharge status: Home / Self Care | Payer: No Typology Code available for payment source | Source: Ambulatory Visit | Attending: Obstetrics & Gynecology | Admitting: Obstetrics & Gynecology

## 2017-12-06 DIAGNOSIS — Z124 Encounter for screening for malignant neoplasm of cervix: Secondary | ICD-10-CM | POA: Insufficient documentation

## 2017-12-06 DIAGNOSIS — Z1151 Encounter for screening for human papillomavirus (HPV): Secondary | ICD-10-CM | POA: Diagnosis not present

## 2017-12-06 NOTE — Addendum Note (Signed)
Addended by: Verner CholLEONARD, Shalyn Koral S on: 12/06/2017 10:03 AM   Modules accepted: Orders

## 2017-12-07 LAB — CYTOLOGY - PAP
Diagnosis: NEGATIVE
HPV: NOT DETECTED

## 2018-01-22 ENCOUNTER — Other Ambulatory Visit: Payer: Self-pay | Admitting: Certified Nurse Midwife

## 2018-01-22 DIAGNOSIS — Z1231 Encounter for screening mammogram for malignant neoplasm of breast: Secondary | ICD-10-CM

## 2018-03-06 ENCOUNTER — Ambulatory Visit
Admission: RE | Admit: 2018-03-06 | Discharge: 2018-03-06 | Disposition: A | Discharge status: Home / Self Care | Payer: No Typology Code available for payment source | Source: Ambulatory Visit | Attending: Certified Nurse Midwife | Admitting: Certified Nurse Midwife

## 2018-03-06 DIAGNOSIS — Z1231 Encounter for screening mammogram for malignant neoplasm of breast: Secondary | ICD-10-CM

## 2018-04-29 ENCOUNTER — Telehealth: Payer: Self-pay | Admitting: Certified Nurse Midwife

## 2018-04-29 NOTE — Telephone Encounter (Signed)
Spoke with patient. Patient has not been SA in over 4 yrs, is in a new relationship planning to be intimate. Has experienced vaginal dryness in the past, would like to be proactive and discuss tx options prior to intercourse.   Recommended OV for further discussion with Leota Sauers, CNM. OV scheduled for 2/11 at 1pm.   Routing to provider for final review. Patient is agreeable to disposition. Will close encounter.

## 2018-04-29 NOTE — Telephone Encounter (Signed)
Patient is asking to talk with a nurse regarding some vaginal dryness.

## 2018-04-30 ENCOUNTER — Other Ambulatory Visit: Payer: Self-pay

## 2018-04-30 ENCOUNTER — Encounter: Payer: Self-pay | Admitting: Certified Nurse Midwife

## 2018-04-30 ENCOUNTER — Ambulatory Visit (INDEPENDENT_AMBULATORY_CARE_PROVIDER_SITE_OTHER): Payer: PRIVATE HEALTH INSURANCE | Admitting: Certified Nurse Midwife

## 2018-04-30 VITALS — BP 104/64 | HR 68 | Resp 16 | Wt 149.0 lb

## 2018-04-30 DIAGNOSIS — N952 Postmenopausal atrophic vaginitis: Secondary | ICD-10-CM

## 2018-04-30 NOTE — Progress Notes (Signed)
Subjective:     Patient ID: Mandy Davis, female   DOB: 09-Dec-1960, 58 y.o.   MRN: [id number]  58 yo g0 p0 single female here to discuss vaginal dryness management.  She has met new partner and would like to be ready if she has sexual activity. Denies vaginal bleeding, but has difficulty with speculum exam with vaginal dryness and aware this may cause discomfort if not prepared. Has not been using any vaginal moisture due to not sexually active for a long time. No history of UTI. No other health concerns today.     Review of Systems  Constitutional: Negative.   HENT: Negative.   Eyes: Negative.   Respiratory: Negative.   Cardiovascular: Negative.   Gastrointestinal: Negative.   Endocrine: Negative.   Genitourinary: Positive for vaginal pain. Negative for pelvic pain, vaginal bleeding and vaginal discharge.       Dryness only Occasional stress incontinence  Musculoskeletal: Negative.   Skin: Negative.   Allergic/Immunologic: Negative.   Neurological: Negative.   Hematological: Negative.   Psychiatric/Behavioral: Negative.        Objective:   Physical Exam Exam conducted with a chaperone present.  Constitutional:      Appearance: Normal appearance.  Cardiovascular:     Rate and Rhythm: Normal rate.  Pulmonary:     Effort: Pulmonary effort is normal.  Abdominal:     Palpations: Abdomen is soft.  Genitourinary:    General: Normal vulva.     Exam position: Lithotomy position.     Pubic Area: No rash.      Labia:        Right: No rash, tenderness, lesion or injury.        Left: No rash, tenderness, lesion or injury.      Vagina: Erythema and tenderness present. No vaginal discharge, bleeding, lesions or prolapsed vaginal walls.     Cervix: Normal.     Uterus: Normal.      Adnexa: Right adnexa normal and left adnexa normal.       Right: No mass, tenderness or fullness.         Left: No mass, tenderness or fullness.       Rectum: Normal.     Comments: Urethral meatus  prominent, Introital area examined with no concerns with lubricant used. Atrophic appearance in left posterior fornix and slightly tender. Dryness noted in vagina, but no tenderness. No pain with speculum exam Lymphadenopathy:     Lower Body: No right inguinal adenopathy. No left inguinal adenopathy.  Skin:    General: Skin is warm and dry.  Neurological:     Mental Status: She is alert and oriented to person, place, and time.  Psychiatric:        Mood and Affect: Mood and affect normal.        Behavior: Behavior normal.        Thought Content: Thought content normal.        Cognition and Memory: Cognition normal.        Judgment: Judgment normal.        Assessment:     Normal pelvic exam Menopausal with slight atrophic vaginal changes and dryness noted    Plan:     Discussed finding of normal pelvic exam.  Discussed vaginal finding and risks/benefits/warning signs if vaginal estrogen use and need to use for about 6 -8 weeks for benefits, but feel moisture is the problem in the majority of vagina. Discussed OTC product use Replens, coconut or Olive oil  use for moisture as well for lubricant with sexual activity.Patient would prefer to work with coconut oil. Instructions for use 3 times weekly and with sexual activity every time.  Avoid hand stimulation by partner due to atrophic/tenderness.  Questions addressed at length. Discussed STD prevention with condom use or both have testing done. She will consider and will advise if needs assistance. Discussed risk of UTI due to prominence of urethra meatus. Empty bladder before and after sexual activity to avoid. Can use Azo OTC if discomfort for first 24 hours, if continues need to call.  Rv prn

## 2018-04-30 NOTE — Patient Instructions (Signed)
Atrophic Vaginitis    Atrophic vaginitis is a condition in which the tissues that line the vagina become dry and thin. This condition is most common in women who have stopped having regular menstrual periods (are in menopause). This usually starts when a woman is 45-55 years old. That is the time when a woman's estrogen levels begin to drop (decrease).  Estrogen is a female hormone. It helps to keep the tissues of the vagina moist. It stimulates the vagina to produce a clear fluid that lubricates the vagina for sexual intercourse. This fluid also protects the vagina from infection. Lack of estrogen can cause the lining of the vagina to get thinner and dryer. The vagina may also shrink in size. It may become less elastic. Atrophic vaginitis tends to get worse over time as a woman's estrogen level drops.  What are the causes?  This condition is caused by the normal drop in estrogen that happens around the time of menopause.  What increases the risk?  Certain conditions or situations may lower a woman's estrogen level, leading to a higher risk for atrophic vaginitis. You are more likely to develop this condition if:   You are taking medicines that block estrogen.   You have had your ovaries removed.   You are being treated for cancer with X-ray (radiation) or medicines (chemotherapy).   You have given birth or are breastfeeding.   You are older than age 50.   You smoke.  What are the signs or symptoms?  Symptoms of this condition include:   Pain, soreness, or bleeding during sexual intercourse (dyspareunia).   Vaginal burning, irritation, or itching.   Pain or bleeding when a speculum is used in a vaginal exam (pelvic exam).   Having burning pain when passing urine.   Vaginal discharge that is brown or yellow.  In some cases, there are no symptoms.  How is this diagnosed?  This condition is diagnosed by taking a medical history and doing a physical exam. This will include a pelvic exam that checks the  vaginal tissues. Though rare, you may also have other tests, including:   A urine test.   A test that checks the acid balance in your vagina (acid balance test).  How is this treated?  Treatment for this condition depends on how severe your symptoms are. Treatment may include:   Using an over-the-counter vaginal lubricant before sex.   Using a long-acting vaginal moisturizer.   Using low-dose vaginal estrogen for moderate to severe symptoms that do not respond to other treatments. Options include creams, tablets, and inserts (vaginal rings). Before you use a vaginal estrogen, tell your health care provider if you have a history of:  ? Breast cancer.  ? Endometrial cancer.  ? Blood clots.  If you are not sexually active and your symptoms are very mild, you may not need treatment.  Follow these instructions at home:  Medicines   Take over-the-counter and prescription medicines only as told by your health care provider. Do not use herbal or alternative medicines unless your health care provider says that you can.   Use over-the-counter creams, lubricants, or moisturizers for dryness only as directed by your health care provider.  General instructions   If your atrophic vaginitis is caused by menopause, discuss all of your menopause symptoms and treatment options with your health care provider.   Do not douche.   Do not use products that can make your vagina dry. These include:  ? Scented feminine sprays.  ?   Scented tampons.  ? Scented soaps.   Vaginal intercourse can help to improve blood flow and elasticity of vaginal tissue. If it hurts to have sex, try using a lubricant or moisturizer just before having intercourse.  Contact a health care provider if:   Your discharge looks different than normal.   Your vagina has an unusual smell.   You have new symptoms.   Your symptoms do not improve with treatment.   Your symptoms get worse.  Summary   Atrophic vaginitis is a condition in which the tissues that  line the vagina become dry and thin. It is most common in women who have stopped having regular menstrual periods (are in menopause).   Treatment options include using vaginal lubricants and low-dose vaginal estrogen.   Contact a health care provider if your vagina has an unusual smell, or if your symptoms get worse or do not improve after treatment.  This information is not intended to replace advice given to you by your health care provider. Make sure you discuss any questions you have with your health care provider.  Document Released: 07/21/2014 Document Revised: 11/30/2016 Document Reviewed: 11/30/2016  Elsevier Interactive Patient Education  2019 [employer]

## 2018-05-20 ENCOUNTER — Telehealth: Payer: Self-pay | Admitting: Certified Nurse Midwife

## 2018-05-20 NOTE — Telephone Encounter (Signed)
Patient is out of town in [place] and is having uti symptoms. She is requesting a [facility] to [facility] at [phone].

## 2018-05-20 NOTE — Telephone Encounter (Signed)
Spoke with patient. Patient reports dysuria and blood in urine, symptoms started 3/1. Denies fever/chills, N/V or lower back pain. Patient is on vacation in [place] until 3/4, requesting RX.   Advised patient further evaluation is needed, seek immediate care at local ER, urgent care or [facility]. If follow up is needed, or symptoms do not resolve,  please return call to office to schedule OV. Advised covering physician will review, our office will return call if any additional recommendations. Patient verbalizes understanding.  Routing to covering provider for final review. Patient is agreeable to disposition. Will close encounter.  Cc: Leota Sauers, CNM

## 2018-05-27 ENCOUNTER — Encounter: Payer: Self-pay | Admitting: Obstetrics & Gynecology

## 2018-05-27 ENCOUNTER — Other Ambulatory Visit: Payer: Self-pay

## 2018-05-27 ENCOUNTER — Ambulatory Visit (INDEPENDENT_AMBULATORY_CARE_PROVIDER_SITE_OTHER): Payer: PRIVATE HEALTH INSURANCE | Admitting: Obstetrics & Gynecology

## 2018-05-27 VITALS — BP 120/84 | HR 60 | Temp 98.1°F | Resp 16 | Ht 69.75 in | Wt 146.0 lb

## 2018-05-27 DIAGNOSIS — R300 Dysuria: Secondary | ICD-10-CM

## 2018-05-27 LAB — POCT URINALYSIS DIPSTICK
BILIRUBIN UA: NEGATIVE
GLUCOSE UA: NEGATIVE
KETONES UA: NEGATIVE
Nitrite, UA: NEGATIVE
PH UA: 6 (ref 5.0–8.0)
Protein, UA: POSITIVE — AB
UROBILINOGEN UA: 0.2 U/dL

## 2018-05-27 MED ORDER — NITROFURANTOIN MONOHYD MACRO 100 MG PO CAPS: 100.0000 mg | ORAL_CAPSULE | Freq: Two times a day (BID) | ORAL | 0 refills | Status: DC

## 2018-05-27 NOTE — Progress Notes (Signed)
GYNECOLOGY  VISIT  CC:   Pain with urination, itching  HPI: 58 y.o. G0P0000 [name] here for dysuria that started about a week ago.  She was treated while she was traveling in [place] last week with bactrim DS BID x 3 days.  She does report there is no urinary urgency or frequency.  Denies back pain but she is having some RLQ pain.  Denies fever since a week ago.  Also denies hematuria.    She is using coconut oil for vaginal moisture.  GYNECOLOGIC HISTORY: Patient's last menstrual period was 04/20/2012. Contraception: PMP Menopausal hormone therapy: none   Patient Active Problem List   Diagnosis Date Noted  . Baker's cyst of knee 03/24/2015    Past Medical History:  Diagnosis Date  . Abnormal Pap smear of cervix    over 30 yrs ago  . Amenorrhea     Past Surgical History:  Procedure Laterality Date  . CERVICAL CONE BIOPSY     over 30 yrs ago  . GUM SURGERY    . WISDOM TOOTH EXTRACTION      MEDS:   Current Outpatient Medications on File Prior to Visit  Medication Sig Dispense Refill  . diclofenac (VOLTAREN) 75 MG EC tablet Take 75 mg by mouth 2 (two) times daily with a meal.    . meloxicam (MOBIC) 15 MG tablet     . Multiple Vitamins-Minerals (MULTIVITAMIN PO) Take by mouth.    . Omega-3 Fatty Acids (OMEGA 3 PO) Take by mouth.     No current facility-administered medications on file prior to visit.     ALLERGIES: Patient has no known allergies.  Family History  Problem Relation Age of Onset  . Anemia Mother   . Heart disease Father   . Colon polyps Maternal Grandfather   . Cancer Maternal Aunt   . Emphysema Paternal Grandfather     SH:  Married, non smoker  Review of Systems  Genitourinary: Positive for dysuria.       Vag Itching   All other systems reviewed and are negative.   PHYSICAL EXAMINATION:    BP 120/84 (BP Location: Right Arm, Patient Position: Sitting, Cuff Size: Normal)   Pulse 60   Temp 98.1 F (36.7 C) (Oral)    Resp 16   Ht 5' 9.75" (1.772 m)   Wt 146 lb (66.2 kg)   LMP 04/20/2012   BMI 21.10 kg/m     General appearance: alert, cooperative and appears stated age Abdomen: soft, non-tender; bowel sounds normal; no masses,  no organomegaly Lymph:  no inguinal LAD noted  Pelvic: External genitalia:  no lesions              Urethra:  normal appearing urethra with no masses, tenderness or lesions              Bartholins and Skenes: normal                 Vagina: normal appearing vagina with normal color and discharge, no lesions              Cervix: no lesions              Bimanual Exam:  Uterus:  normal size, contour, position, consistency, mobility, non-tender              Adnexa: no mass, fullness, tenderness  Chaperone was present for exam.  Assessment: Possible UTI, partially treated RLQ discomfort  Plan: Urine micro and culture  pending Macrobid 100mg  bid x 5 days sent to pharmacy.

## 2018-05-28 LAB — URINALYSIS, MICROSCOPIC ONLY
Casts: NONE SEEN /lpf
Epithelial Cells (non renal): NONE SEEN /hpf (ref 0–10)

## 2018-05-29 LAB — URINE CULTURE

## 2018-12-09 ENCOUNTER — Other Ambulatory Visit: Payer: Self-pay

## 2018-12-11 ENCOUNTER — Ambulatory Visit (INDEPENDENT_AMBULATORY_CARE_PROVIDER_SITE_OTHER): Payer: Commercial Managed Care - PPO | Admitting: Certified Nurse Midwife

## 2018-12-11 ENCOUNTER — Other Ambulatory Visit (HOSPITAL_COMMUNITY)
Admission: RE | Admit: 2018-12-11 | Discharge: 2018-12-11 | Disposition: A | Discharge status: Home / Self Care | Payer: Commercial Managed Care - PPO | Source: Ambulatory Visit | Attending: Certified Nurse Midwife | Admitting: Certified Nurse Midwife

## 2018-12-11 ENCOUNTER — Encounter: Payer: Self-pay | Admitting: Certified Nurse Midwife

## 2018-12-11 ENCOUNTER — Other Ambulatory Visit: Payer: Self-pay

## 2018-12-11 VITALS — BP 110/70 | HR 60 | Temp 97.2°F | Resp 16 | Ht 69.5 in | Wt 150.0 lb

## 2018-12-11 DIAGNOSIS — Z113 Encounter for screening for infections with a predominantly sexual mode of transmission: Secondary | ICD-10-CM

## 2018-12-11 DIAGNOSIS — Z01419 Encounter for gynecological examination (general) (routine) without abnormal findings: Secondary | ICD-10-CM

## 2018-12-11 DIAGNOSIS — Z124 Encounter for screening for malignant neoplasm of cervix: Secondary | ICD-10-CM | POA: Diagnosis not present

## 2018-12-11 NOTE — Progress Notes (Signed)
58 y.o. G0P0000 Widowed  Caucasian Fe here for annual exam. Menopausal occasional hot flashes and night sweats now. Some vaginal dryness she has noted, she is sexually active now, but using moisture and lubrication working well. Requests STD screening to be sure, no issues. Seeing Orthopedic for right knee pain, Bakers cyst present. Sees Dr. Wylene Simmer for aex and labs this year. No other health issues today.  Patient's last menstrual period was 04/20/2012.          Sexually active: Yes.    The current method of family planning is post menopausal status.    Exercising: Yes.    aerobics & walking Smoker:  no  Review of Systems  Constitutional: Negative.   HENT: Negative.   Eyes: Negative.   Respiratory: Negative.   Cardiovascular: Negative.   Gastrointestinal: Negative.   Genitourinary: Negative.   Musculoskeletal: Negative.   Skin: Negative.   Neurological: Negative.   Endo/Heme/Allergies: Negative.   Psychiatric/Behavioral: Negative.     Health Maintenance: Pap:  11-17-14 neg HPV HR neg, 12-06-17 neg HPV HR neg History of Abnormal Pap: no MMG:  03-06-18 category d density birads 1:neg Self Breast exams: no Colonoscopy:  2015 f/u 72yrs BMD:   none TDaP:  2015 Shingles: 2018, 2019 Pneumonia: no Hep C and HIV: both neg 2017 Labs: yes   reports that she has never smoked. She has never used smokeless tobacco. She reports current alcohol use of about 14.0 standard drinks of alcohol per week. She reports that she does not use drugs.  Past Medical History:  Diagnosis Date  . Abnormal Pap smear of cervix    over 30 yrs ago  . Amenorrhea     Past Surgical History:  Procedure Laterality Date  . CERVICAL CONE BIOPSY     over 30 yrs ago  . GUM SURGERY    . WISDOM TOOTH EXTRACTION      Current Outpatient Medications  Medication Sig Dispense Refill  . Boswellia-Glucosamine-Vit D (OSTEO BI-FLEX ONE PER DAY PO) Take by mouth.    Marland Kitchen MAGNESIUM PO Take 250 mg by mouth.    . meloxicam  (MOBIC) 15 MG tablet     . Multiple Vitamins-Minerals (MULTIVITAMIN PO) Take by mouth.    . Omega-3 Fatty Acids (OMEGA 3 PO) Take by mouth.     No current facility-administered medications for this visit.     Family History  Problem Relation Age of Onset  . Anemia Mother   . Heart disease Father   . Colon polyps Maternal Grandfather   . Cancer Maternal Aunt   . Emphysema Paternal Grandfather     ROS:  Pertinent items are noted in HPI.  Otherwise, a comprehensive ROS was negative.  Exam:   BP 110/70   Pulse 60   Temp (!) 97.2 F (36.2 C) (Skin)   Resp 16   Ht 5' 9.5" (1.765 m)   Wt 150 lb (68 kg)   LMP 04/20/2012   BMI 21.83 kg/m  Height: 5' 9.5" (176.5 cm) Ht Readings from Last 3 Encounters:  12/11/18 5' 9.5" (1.765 m)  05/27/18 5' 9.75" (1.772 m)  12/05/17 5' 9.75" (1.772 m)    General appearance: alert, cooperative and appears stated age Head: Normocephalic, without obvious abnormality, atraumatic Neck: no adenopathy, supple, symmetrical, trachea midline and thyroid normal to inspection and palpation Lungs: clear to auscultation bilaterally Breasts: normal appearance, no masses or tenderness, No nipple retraction or dimpling, No nipple discharge or bleeding, No axillary or supraclavicular adenopathy Heart: regular  rate and rhythm Abdomen: soft, non-tender; no masses,  no organomegaly Extremities: extremities normal, atraumatic, no cyanosis or edema Skin: Skin color, texture, turgor normal. No rashes or lesions Lymph nodes: Cervical, supraclavicular, and axillary nodes normal. No abnormal inguinal nodes palpated Neurologic: Grossly normal   Pelvic: External genitalia:  no lesions              Urethra:  normal appearing urethra with no masses, tenderness or lesions              Bartholin's and Skene's: normal                 Vagina: normal appearing vagina with normal color and discharge, no lesions              Cervix: no cervical motion tenderness, no lesions  and normal appearance              Pap taken: Yes.   Bimanual Exam:  Uterus:  normal size, contour, position, consistency, mobility, non-tender and anteverted              Adnexa: normal adnexa and no mass, fullness, tenderness               Rectovaginal: Confirms               Anus:  normal sphincter tone, no lesions  Chaperone present: yes  A:  Well Woman with normal exam  Menopausal no HRT  Sexually active now, desires STD screening (will do HIV,RPR, Hep C with PCP)  P:   Reviewed health and wellness pertinent to exam  Aware of need to advise if vaginal bleeding.  Discussed STD prevention with condom use. Questions addressed.  Lab: GC/Chlamydia, Affirm  Pap smear: yes   counseled on breast self exam, mammography screening, STD prevention, HIV risk factors and prevention, adequate intake of calcium and vitamin D, diet and exercise  return annually or prn  An After Visit Summary was printed and given to the patient.

## 2018-12-11 NOTE — Patient Instructions (Signed)

## 2018-12-12 LAB — VAGINITIS/VAGINOSIS, DNA PROBE
Candida Species: NEGATIVE
Gardnerella vaginalis: NEGATIVE
Trichomonas vaginosis: NEGATIVE

## 2018-12-18 ENCOUNTER — Telehealth: Payer: Self-pay | Admitting: *Deleted

## 2018-12-18 NOTE — Telephone Encounter (Signed)
Notes recorded by Regina Eck, CNM on 12/17/2018 at 7:24 AM EDT  Notify patient her vaginal screening was negative for yeast, BV and trichomonas  Chlamydia and Gonorrhea are negative  Pap smear is negative, atrophy noted due to menopause but positive for HPV, waiting for results of HPV typing for 16, 18, 45 to determine follow up.  Molecular pathology report also received from Johny Shock "notify patient that HPV high risk negative for 16/18/45. Repeat pap 1 year. 12 recall. 5 year risk of CIN 3: 2.25%."  12 recall placed for 09/21.

## 2018-12-18 NOTE — Telephone Encounter (Signed)
Message left to return call to Triage Nurse at [phone].    

## 2018-12-19 NOTE — Telephone Encounter (Signed)
Spoke with patient. Advised of all results as seen below per Melvia Heaps, CNM. Patient verbalizes understanding and is agreeable.   Next AEX 12/11/19.   Encounter closed.

## 2018-12-20 LAB — CYTOLOGY - PAP
Chlamydia: NEGATIVE
Diagnosis: NEGATIVE
HPV 16: NEGATIVE
HPV 18 / 45: NEGATIVE
High risk HPV: POSITIVE — AB
Molecular Disclaimer: 56
Molecular Disclaimer: NEGATIVE
Molecular Disclaimer: NEGATIVE
Molecular Disclaimer: NORMAL
Molecular Disclaimer: NORMAL
Neisseria Gonorrhea: NEGATIVE

## 2019-01-27 ENCOUNTER — Other Ambulatory Visit: Payer: Self-pay | Admitting: Certified Nurse Midwife

## 2019-01-27 DIAGNOSIS — Z1231 Encounter for screening mammogram for malignant neoplasm of breast: Secondary | ICD-10-CM

## 2019-03-18 ENCOUNTER — Ambulatory Visit
Admission: RE | Admit: 2019-03-18 | Discharge: 2019-03-18 | Disposition: A | Discharge status: Home / Self Care | Payer: Commercial Managed Care - PPO | Source: Ambulatory Visit | Attending: Certified Nurse Midwife | Admitting: Certified Nurse Midwife

## 2019-03-18 ENCOUNTER — Other Ambulatory Visit: Payer: Self-pay

## 2019-03-18 DIAGNOSIS — Z1231 Encounter for screening mammogram for malignant neoplasm of breast: Secondary | ICD-10-CM

## 2019-06-11 ENCOUNTER — Encounter: Payer: Self-pay | Admitting: Certified Nurse Midwife

## 2019-12-16 ENCOUNTER — Ambulatory Visit: Payer: Commercial Managed Care - PPO | Admitting: Certified Nurse Midwife

## 2020-02-09 ENCOUNTER — Other Ambulatory Visit: Payer: Self-pay | Admitting: Certified Nurse Midwife

## 2020-02-09 DIAGNOSIS — Z0000 Encounter for general adult medical examination without abnormal findings: Secondary | ICD-10-CM

## 2020-03-25 ENCOUNTER — Ambulatory Visit: Payer: No Typology Code available for payment source

## 2020-04-20 ENCOUNTER — Other Ambulatory Visit: Payer: Self-pay | Admitting: Obstetrics and Gynecology

## 2020-04-20 ENCOUNTER — Ambulatory Visit
Admission: RE | Admit: 2020-04-20 | Discharge: 2020-04-20 | Disposition: A | Discharge status: Home / Self Care | Payer: Commercial Managed Care - PPO | Source: Ambulatory Visit | Attending: Cardiology | Admitting: Cardiology

## 2020-04-20 ENCOUNTER — Other Ambulatory Visit: Payer: Self-pay

## 2020-04-20 DIAGNOSIS — Z0000 Encounter for general adult medical examination without abnormal findings: Secondary | ICD-10-CM

## 2020-06-17 ENCOUNTER — Encounter: Payer: Self-pay | Admitting: Obstetrics and Gynecology

## 2020-06-17 ENCOUNTER — Other Ambulatory Visit: Payer: Self-pay

## 2020-06-17 ENCOUNTER — Ambulatory Visit (INDEPENDENT_AMBULATORY_CARE_PROVIDER_SITE_OTHER): Payer: Commercial Managed Care - PPO | Admitting: Obstetrics and Gynecology

## 2020-06-17 VITALS — BP 122/64 | HR 60 | Ht 70.0 in | Wt 154.0 lb

## 2020-06-17 DIAGNOSIS — K5900 Constipation, unspecified: Secondary | ICD-10-CM

## 2020-06-17 DIAGNOSIS — Z01419 Encounter for gynecological examination (general) (routine) without abnormal findings: Secondary | ICD-10-CM | POA: Diagnosis not present

## 2020-06-17 DIAGNOSIS — N952 Postmenopausal atrophic vaginitis: Secondary | ICD-10-CM | POA: Diagnosis not present

## 2020-06-17 NOTE — Progress Notes (Signed)
60 y.o. G0P0000 Widowed White or Caucasian Not Hispanic or Latino female here for annual exam.  Patient states that she is feeling more bloated and gassy in the last month, some increase in constipation. Has BM 3-4 x a week. Feels stress related.     Sexually active, same partner x 2 years. Uses a lubricant which helps with dryness.   No bladder c/o.   Patient's last menstrual period was 04/20/2012.          Sexually active: Yes.    The current method of family planning is post menopausal status.    Exercising: Yes.    Cardio and strength  Smoker:  no  Health Maintenance: Pap:  12-06-17 neg HPV HR neg,  11-17-14 neg HPV HR neg History of abnormal Pap:  [facility] biopsy over 30 years ago, normal since then.  MMG:  04/20/20 bi-rads 1 neg  BMD:   None  Colonoscopy: 2015 f/u 30yrs TDaP:  2015 Gardasil: none    reports that she has never smoked. She has never used smokeless tobacco. She reports current alcohol use of about 14.0 standard drinks of alcohol per week. She reports that she does not use drugs. Works for Insurance claims handler, currently short staffed.   Past Medical History:  Diagnosis Date  . Abnormal Pap smear of cervix    over 30 yrs ago  . Amenorrhea     Past Surgical History:  Procedure Laterality Date  . CERVICAL CONE BIOPSY     over 30 yrs ago  . GUM SURGERY    . WISDOM TOOTH EXTRACTION      Current Outpatient Medications  Medication Sig Dispense Refill  . Boswellia-Glucosamine-Vit D (OSTEO BI-FLEX ONE PER DAY PO) Take by mouth.    Marland Kitchen MAGNESIUM PO Take 250 mg by mouth.    . meloxicam (MOBIC) 15 MG tablet     . Multiple Vitamins-Minerals (MULTIVITAMIN PO) Take by mouth.    . Omega-3 Fatty Acids (OMEGA 3 PO) Take by mouth.     No current facility-administered medications for this visit.    Family History  Problem Relation Age of Onset  . Anemia Mother   . Heart disease Father   . Colon polyps Maternal Grandfather   . Cancer Maternal Aunt   .  Emphysema Paternal Grandfather     Review of Systems  All other systems reviewed and are negative.   Exam:   LMP 04/20/2012   Weight change: @WEIGHTCHANGE @ Height:      Ht Readings from Last 3 Encounters:  12/11/18 5' 9.5" (1.765 m)  05/27/18 5' 9.75" (1.772 m)  12/05/17 5' 9.75" (1.772 m)    General appearance: alert, cooperative and appears stated age Head: Normocephalic, without obvious abnormality, atraumatic Neck: no adenopathy, supple, symmetrical, trachea midline and thyroid normal to inspection and palpation Lungs: clear to auscultation bilaterally Cardiovascular: regular rate and rhythm Breasts: normal appearance, no masses or tenderness Abdomen: soft, non-tender; non distended,  no masses,  no organomegaly Extremities: extremities normal, atraumatic, no cyanosis or edema Skin: Skin color, texture, turgor normal. No rashes or lesions Lymph nodes: Cervical, supraclavicular, and axillary nodes normal. No abnormal inguinal nodes palpated Neurologic: Grossly normal   Pelvic: External genitalia:  no lesions              Urethra:  normal appearing urethra with no masses, tenderness or lesions              Bartholins and Skenes: normal  Vagina: very atrophic appearing vagina with normal color and discharge, no lesions              Cervix: no lesions               Bimanual Exam:  Uterus:  normal size, contour, position, consistency, mobility, non-tender              Adnexa: no mass, fullness, tenderness               Rectovaginal: Confirms               Anus:  normal sphincter tone, no lesions  Mandy Davis chaperoned for the exam.  1. Well woman exam Discussed breast self exam Discussed calcium and vit D intake Mammogram and colonoscopy are UTD Labs with primary  2. Constipation, unspecified constipation type Discussed, information given  3. Vaginal atrophy Discussed the option of vaginal estrogen, she will let me know if she wants to start it.

## 2020-06-17 NOTE — Patient Instructions (Signed)
Try miralax for constipation  About Constipation  Constipation Overview Constipation is the most common gastrointestinal complaint - about 4 million Americans experience constipation and make 2.5 million physician visits a year to get help for the problem.  Constipation can occur when the colon absorbs too much water, the colon's muscle contraction is slow or sluggish, and/or there is delayed transit time through the colon.  The result is stool that is hard and dry.  Indicators of constipation include straining during bowel movements greater than 25% of the time, having fewer than three bowel movements per week, and/or the feeling of incomplete evacuation.  There are established guidelines ([place] II ) for defining constipation. A person needs to have two or more of the following symptoms for at least 12 weeks (not necessarily consecutive) in the preceding 12 months: . Straining in  greater than 25% of bowel movements . Lumpy or hard stools in greater than 25% of bowel movements . Sensation of incomplete emptying in greater than 25% of bowel movements . Sensation of anorectal obstruction/blockade in greater than 25% of bowel movements . Manual maneuvers to help empty greater than 25% of bowel movements (e.g., digital evacuation, support of the pelvic floor)  . Less than  3 bowel movements/week . Loose stools are not present, and criteria for irritable bowel syndrome are insufficient  Common Causes of Constipation . Lack of fiber in your diet . Lack of physical activity . Medications, including iron and calcium supplements  . Dairy intake . Dehydration . Abuse of laxatives  Travel  Irritable Bowel Syndrome  Pregnancy  Luteal phase of menstruation (after ovulation and before menses)  Colorectal problems  Intestinal Dysfunction  Treating Constipation  There are several ways of treating constipation, including changes to diet and exercise, use of laxatives, adjustments to the pelvic  floor, and scheduled toileting.  These treatments include: . increasing fiber and fluids in the diet  . increasing physical activity . learning muscle coordination   learning proper toileting techniques and toileting modifications   designing and sticking  to a toileting schedule     2007, [employer] Doc.22  EXERCISE   We recommended that you start or continue a regular exercise program for good health. Physical activity is anything that gets your body moving, some is better than none. The CDC recommends 150 minutes per week of Moderate-Intensity Aerobic Activity and 2 or more days of Muscle Strengthening Activity.  Benefits of exercise are limitless: helps weight loss/weight maintenance, improves mood and energy, helps with depression and anxiety, improves sleep, tones and strengthens muscles, improves balance, improves bone density, protects from chronic conditions such as heart disease, high blood pressure and diabetes and so much more. To learn more visit: https://www.cdc.gov/physicalactivity/index.html  DIET: Good nutrition starts with a healthy diet of fruits, vegetables, whole grains, and lean protein sources. Drink plenty of water for hydration. Minimize empty calories, sodium, sweets. For more information about dietary recommendations visit: https://health.gov/our-work/nutrition-physical-activity/dietary-guidelines and https://www.myplate.gov/  ALCOHOL:  Women should limit their alcohol intake to no more than 7 drinks/beers/glasses of wine (combined, not each!) per week. Moderation of alcohol intake to this level decreases your risk of breast cancer and liver damage.  If you are concerned that you may have a problem, or your friends have told you they are concerned about your drinking, there are many resources to help. A well-known program that is free, effective, and available to all people all over the nation is Alcoholics Anonymous.  Check out this site to learn   learn  more: [employer]ageBargains.co.za   CALCIUM AND VITAMIN D:  Adequate intake of calcium and Vitamin D are recommended for bone health.  You should be getting between 1000-1200 mg of calcium and 800 units of Vitamin D daily between diet and supplements  PAP SMEARS:  Pap smears, to check for cervical cancer or precancers,  have traditionally been done yearly, scientific advances have shown that most women can have pap smears less often.  However, every woman still should have a physical exam from her gynecologist every year. It will include a breast check, inspection of the vulva and vagina to check for abnormal growths or skin changes, a visual exam of the cervix, and then an exam to evaluate the size and shape of the uterus and ovaries. We will also provide age appropriate advice regarding health maintenance, like when you should have certain vaccines, screening for sexually transmitted diseases, bone density testing, colonoscopy, mammograms, etc.   MAMMOGRAMS:  All women over 71 years old should have a routine mammogram.   COLON CANCER SCREENING: Now recommend starting at age 58. At this time colonoscopy is not covered for routine screening until 50. There are take home tests that can be done between 45-49.   COLONOSCOPY:  Colonoscopy to screen for colon cancer is recommended for all women at age 65.  We know, you hate the idea of the prep.  We agree, BUT, having colon cancer and not knowing it is worse!!  Colon cancer so often starts as a polyp that can be seen and removed at colonscopy, which can quite literally save your life!  And if your first colonoscopy is normal and you have no family history of colon cancer, most women don't have to have it again for 10 years.  Once every ten years, you can do something that may end up saving your life, right?  We will be happy to help you get it scheduled when you are ready.  Be sure to check your insurance coverage so you understand how much it will cost.  It may be  covered as a preventative service at no cost, but you should check your particular policy.      Breast Self-Awareness Breast self-awareness means being familiar with how your breasts look and feel. It involves checking your breasts regularly and reporting any changes to your health care provider. Practicing breast self-awareness is important. A change in your breasts can be a sign of a serious medical problem. Being familiar with how your breasts look and feel allows you to find any problems early, when treatment is more likely to be successful. All women should practice breast self-awareness, including women who have had breast implants. How to do a breast self-exam One way to learn what is normal for your breasts and whether your breasts are changing is to do a breast self-exam. To do a breast self-exam: Look for Changes  1. Remove all the clothing above your waist. 2. Stand in front of a mirror in a room with good lighting. 3. Put your hands on your hips. 4. Push your hands firmly downward. 5. Compare your breasts in the mirror. Look for differences between them (asymmetry), such as: ? Differences in shape. ? Differences in size. ? Puckers, dips, and bumps in one breast and not the other. 6. Look at each breast for changes in your skin, such as: ? Redness. ? Scaly areas. 7. Look for changes in your nipples, such as: ? Discharge. ? Bleeding. ? Dimpling. ? Redness. ?  A change in position. Feel for Changes Carefully feel your breasts for lumps and changes. It is best to do this while lying on your back on the floor and again while sitting or standing in the shower or tub with soapy water on your skin. Feel each breast in the following way:  Place the arm on the side of the breast you are examining above your head.  Feel your breast with the other hand.  Start in the nipple area and make  inch (2 cm) overlapping circles to feel your breast. Use the pads of your three middle fingers to do  this. Apply light pressure, then medium pressure, then firm pressure. The light pressure will allow you to feel the tissue closest to the skin. The medium pressure will allow you to feel the tissue that is a little deeper. The firm pressure will allow you to feel the tissue close to the ribs.  Continue the overlapping circles, moving downward over the breast until you feel your ribs below your breast.  Move one finger-width toward the center of the body. Continue to use the  inch (2 cm) overlapping circles to feel your breast as you move slowly up toward your collarbone.  Continue the up and down exam using all three pressures until you reach your armpit.  Write Down What You Find  Write down what is normal for each breast and any changes that you find. Keep a written record with breast changes or normal findings for each breast. By writing this information down, you do not need to depend only on memory for size, tenderness, or location. Write down where you are in your menstrual cycle, if you are still menstruating. If you are having trouble noticing differences in your breasts, do not get discouraged. With time you will become more familiar with the variations in your breasts and more comfortable with the exam. How often should I examine my breasts? Examine your breasts every month. If you are breastfeeding, the best time to examine your breasts is after a feeding or after using a breast pump. If you menstruate, the best time to examine your breasts is 5-7 days after your period is over. During your period, your breasts are lumpier, and it may be more difficult to notice changes. When should I see my health care provider? See your health care provider if you notice:  A change in shape or size of your breasts or nipples.  A change in the skin of your breast or nipples, such as a reddened or scaly area.  Unusual discharge from your nipples.  A lump or thick area that was not there  before.  Pain in your breasts.  Anything that concerns you.  

## 2021-03-18 ENCOUNTER — Other Ambulatory Visit: Payer: Self-pay | Admitting: Obstetrics and Gynecology

## 2021-03-18 DIAGNOSIS — Z1231 Encounter for screening mammogram for malignant neoplasm of breast: Secondary | ICD-10-CM

## 2021-04-21 ENCOUNTER — Ambulatory Visit
Admission: RE | Admit: 2021-04-21 | Discharge: 2021-04-21 | Disposition: A | Discharge status: Home / Self Care | Payer: Commercial Managed Care - PPO | Source: Ambulatory Visit | Attending: Obstetrics and Gynecology | Admitting: Obstetrics and Gynecology

## 2021-04-21 DIAGNOSIS — Z1231 Encounter for screening mammogram for malignant neoplasm of breast: Secondary | ICD-10-CM

## 2021-06-15 NOTE — Progress Notes (Signed)
61 y.o. G0P0000 Widowed White or Caucasian Not Hispanic or Latino female here for annual exam.  No vaginal bleeding. Recently broke up with her boyfriend of 3 years. Not currently sexually active.  ? ?No bowel or bladder issues. Some baseline constipation.  ?  ? ?Patient's last menstrual period was 04/20/2012.          ?Sexually active: Yes.    ?The current method of family planning is post menopausal status.    ?Exercising: Yes.    Home exercise routine includes walking 1 hrs per day. Gym/ health club routine includes cardio. ?Smoker:  no ? ?Health Maintenance: ?Pap:  12/11/2018 WNL Hr HPV + 16/18 45 neg, 12-06-17 neg HPV HR neg ?History of abnormal Pap:  yes, [facility] biopsy over 30 years  ?MMG:  04/21/21 density D Bi-rads 1 neg  ?BMD:   none  ?Colonoscopy: 07/22/13 f/u 10 years  ?TDaP:  2015  ?Gardasil: none  ? ? reports that she has never smoked. She has never used smokeless tobacco. She reports current alcohol use of about 14.0 standard drinks per week. She reports that she does not use drugs. 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 She has worked for [employer] for over 20 years. Hopes to retire at 28. ? ?Past Medical History:  ?Diagnosis Date  ? Abnormal Pap smear of cervix   ? over 30 yrs ago  ? Amenorrhea   ? ? ?Past Surgical History:  ?Procedure Laterality Date  ? CERVICAL CONE BIOPSY    ? over 30 yrs ago  ? GUM SURGERY    ? WISDOM TOOTH EXTRACTION    ? ? ?No current outpatient medications on file.  ? ?No current facility-administered medications for this visit.  ? ? ?Family History  ?Problem Relation Age of Onset  ? Anemia Mother   ? Heart disease Father   ? Colon polyps Maternal Grandfather   ? Cancer Maternal Aunt   ? Emphysema Paternal Grandfather   ? ? ?Review of Systems ? ?Exam:   ?BP 134/80 (BP Location: Right Arm, Patient Position: Sitting, Cuff Size: Normal)   Ht 5\' 9"  (1.753 m)   Wt 149 lb (67.6 kg)   LMP  04/20/2012   BMI 22.00 kg/m?   Weight change: @WEIGHTCHANGE @ Height:   Height: 5\' 9"  (175.3 cm)  ?Ht Readings from Last 3 Encounters:  ?06/22/21 5\' 9"  (1.753 m)  ?06/17/20 5\' 10"  (1.778 m)  ?12/11/18 5' 9.5" (1.765 m)  ? ? ?General appearance: alert, cooperative and appears stated age ?Head: Normocephalic, without obvious abnormality, atraumatic ?Neck: no adenopathy, supple, symmetrical, trachea midline and thyroid normal to inspection and palpation ?Lungs: clear to auscultation bilaterally ?Cardiovascular: regular rate and rhythm ?Breasts: normal appearance, no masses or tenderness ?Abdomen: soft, non-tender; non distended,  no masses,  no organomegaly ?Extremities: extremities normal, atraumatic, no cyanosis or edema ?Skin: Skin color, texture, turgor normal. No rashes or lesions ?Lymph nodes: Cervical, supraclavicular, and axillary nodes normal. ?No abnormal inguinal nodes palpated ?Neurologic: Grossly normal ? ? ?Pelvic: External genitalia:  no lesions ?             Urethra:  normal appearing urethra with no masses, tenderness or lesions ?             Bartholins and Skenes: normal    ?             Vagina: atrophic appearing vagina with normal color and discharge,  no lesions ?             Cervix: no lesions ?              ?Bimanual Exam:  Uterus:  normal size, contour, position, consistency, mobility, non-tender ?             Adnexa: no mass, fullness, tenderness ?              Rectovaginal: Confirms ?              Anus:  normal sphincter tone, no lesions ? ?Wandra Scot, CMA chaperoned for the exam. ? ?1. Well woman exam ?Discussed breast self exam ?Discussed calcium and vit D intake ?Labs with primary ?Mammogram and colonoscopy UTD ? ?2. Screening for cervical cancer ?Last pap was +HPV ?- Cytology - PAP with hpv ? ? ?

## 2021-06-22 ENCOUNTER — Ambulatory Visit (INDEPENDENT_AMBULATORY_CARE_PROVIDER_SITE_OTHER): Payer: Commercial Managed Care - PPO | Admitting: Obstetrics and Gynecology

## 2021-06-22 ENCOUNTER — Encounter: Payer: Self-pay | Admitting: Obstetrics and Gynecology

## 2021-06-22 ENCOUNTER — Other Ambulatory Visit (HOSPITAL_COMMUNITY)
Admission: RE | Admit: 2021-06-22 | Discharge: 2021-06-22 | Disposition: A | Discharge status: Home / Self Care | Payer: Commercial Managed Care - PPO | Source: Ambulatory Visit | Attending: Obstetrics and Gynecology | Admitting: Obstetrics and Gynecology

## 2021-06-22 VITALS — BP 134/80 | Ht 69.0 in | Wt 149.0 lb

## 2021-06-22 DIAGNOSIS — Z01419 Encounter for gynecological examination (general) (routine) without abnormal findings: Secondary | ICD-10-CM

## 2021-06-22 DIAGNOSIS — Z8616 Personal history of COVID-19: Secondary | ICD-10-CM | POA: Insufficient documentation

## 2021-06-22 DIAGNOSIS — Z124 Encounter for screening for malignant neoplasm of cervix: Secondary | ICD-10-CM | POA: Insufficient documentation

## 2021-06-22 NOTE — Patient Instructions (Signed)

## 2021-06-24 LAB — CYTOLOGY - PAP
Comment: NEGATIVE
High risk HPV: POSITIVE — AB

## 2021-06-27 ENCOUNTER — Other Ambulatory Visit: Payer: Self-pay | Admitting: *Deleted

## 2021-06-27 DIAGNOSIS — R87612 Low grade squamous intraepithelial lesion on cytologic smear of cervix (LGSIL): Secondary | ICD-10-CM

## 2021-06-27 DIAGNOSIS — R87810 Cervical high risk human papillomavirus (HPV) DNA test positive: Secondary | ICD-10-CM

## 2021-07-25 ENCOUNTER — Other Ambulatory Visit (HOSPITAL_COMMUNITY)
Admission: RE | Admit: 2021-07-25 | Discharge: 2021-07-25 | Disposition: A | Discharge status: Home / Self Care | Payer: Commercial Managed Care - PPO | Source: Ambulatory Visit | Attending: Obstetrics and Gynecology | Admitting: Obstetrics and Gynecology

## 2021-07-25 ENCOUNTER — Ambulatory Visit (INDEPENDENT_AMBULATORY_CARE_PROVIDER_SITE_OTHER): Payer: Commercial Managed Care - PPO | Admitting: Obstetrics and Gynecology

## 2021-07-25 ENCOUNTER — Encounter: Payer: Self-pay | Admitting: Obstetrics and Gynecology

## 2021-07-25 VITALS — BP 120/72 | HR 82 | Ht 70.0 in | Wt 150.0 lb

## 2021-07-25 DIAGNOSIS — R87810 Cervical high risk human papillomavirus (HPV) DNA test positive: Secondary | ICD-10-CM | POA: Diagnosis not present

## 2021-07-25 DIAGNOSIS — R87612 Low grade squamous intraepithelial lesion on cytologic smear of cervix (LGSIL): Secondary | ICD-10-CM | POA: Insufficient documentation

## 2021-07-25 NOTE — Progress Notes (Signed)
GYNECOLOGY  VISIT ?  ?HPI: ?61 y.o.   Widowed White or Caucasian Not Hispanic or Latino  female   ?G0P0000 with Patient's last menstrual period was 04/20/2012.   ?here for colposcopy.  Recent pap with LSIL, +HPV ? ?GYNECOLOGIC HISTORY: ?Patient's last menstrual period was 04/20/2012. ?Contraception:PMP  ?Menopausal hormone therapy: none  ?       ?OB History   ? ? Gravida  ?0  ? Para  ?0  ? Term  ?0  ? Preterm  ?0  ? AB  ?0  ? Living  ?0  ?  ? ? SAB  ?0  ? IAB  ?0  ? Ectopic  ?0  ? Multiple  ?0  ? Live Births  ?0  ?   ?  ?  ?    ? ?Patient Active Problem List  ? Diagnosis Date Noted  ? Personal history of COVID-19 06/22/2021  ? Baker's cyst of knee 03/24/2015  ? Osteoarthritis 01/08/2009  ? ? ?Past Medical History:  ?Diagnosis Date  ? Abnormal Pap smear of cervix   ? over 30 yrs ago  ? Amenorrhea   ? ? ?Past Surgical History:  ?Procedure Laterality Date  ? CERVICAL CONE BIOPSY    ? over 30 yrs ago  ? GUM SURGERY    ? WISDOM TOOTH EXTRACTION    ? ? ?No current outpatient medications on file.  ? ?No current facility-administered medications for this visit.  ?  ? ?ALLERGIES: Patient has no known allergies. ? ?Family History  ?Problem Relation Age of Onset  ? Anemia Mother   ? Heart disease Father   ? Colon polyps Maternal Grandfather   ? Cancer Maternal Aunt   ? Emphysema Paternal Grandfather   ? ? ?Social History  ? ?Socioeconomic History  ? Marital status: Widowed  ?  Spouse name: Not on file  ? Number of children: Not on file  ? Years of education: Not on file  ? Highest education level: Not on file  ?Occupational History  ? Not on file  ?Tobacco Use  ? Smoking status: Never  ? Smokeless tobacco: Never  ?Vaping Use  ? Vaping Use: Never used  ?Substance and Sexual Activity  ? Alcohol use: Yes  ?  Alcohol/week: 14.0 standard drinks  ?  Types: 14 Standard drinks or equivalent per week  ? Drug use: No  ? Sexual activity: Yes  ?  Partners: Male  ?  Birth control/protection: Post-menopausal  ?Other Topics Concern  ? Not on  file  ?Social History Narrative  ? Husband age 32 died 2013-11-06 apparent heart related.  ? ?Social Determinants of Health  ? ?Financial Resource Strain: Not on file  ?Food Insecurity: Not on file  ?Transportation Needs: Not on file  ?Physical Activity: Not on file  ?Stress: Not on file  ?Social Connections: Not on file  ?Intimate Partner Violence: Not on file  ? ? ?Review of Systems  ?All other systems reviewed and are negative. ? ?PHYSICAL EXAMINATION:   ? ?BP 120/72   Pulse 82   Ht 5\' 10"  (1.778 m)   Wt 150 lb (68 kg)   LMP 04/20/2012   SpO2 100%   BMI 21.52 kg/m?     ?General appearance: alert, cooperative and appears stated age ? ?Pelvic: External genitalia:  no lesions ?             Urethra:  normal appearing urethra with no masses, tenderness or lesions ?  Bartholins and Skenes: normal    ?             Vagina: normal appearing vagina with normal color and discharge, no lesions ?             Cervix: no lesions, stenotic, flush with the vagina anteriorly ? ?Colposcopy: unsatisfactory. No aceto-white changes. Decreased lugols diffusely, more pronounced on the left vaginal side wall. Biopsy taken at 2 o'clock.  ?Needed to place a tenaculum and dilate the cervix to obtain an ECC. Hemostasis obtained with silver nitrate ?              ?Chaperone was present for exam. ? ?1. Low grade squamous intraepithelial lesion (LGSIL) on Papanicolaou smear of cervix ?- Colposcopy ?- Surgical pathology( [place]/ [facility]) ? ?2. Cervical high risk HPV (human papillomavirus) test positive ?- Colposcopy ?- Surgical pathology( [place]/ [facility]) ? ?

## 2021-07-25 NOTE — Patient Instructions (Signed)

## 2021-08-01 LAB — SURGICAL PATHOLOGY

## 2021-08-03 ENCOUNTER — Telehealth: Payer: Self-pay | Admitting: *Deleted

## 2021-08-03 DIAGNOSIS — N893 Dysplasia of vagina, unspecified: Secondary | ICD-10-CM

## 2021-08-03 NOTE — Telephone Encounter (Signed)
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 The area of vaginal dysplasia was large. I would recommend she have a consultation with Dr Berline Lopes to get her opinion as to the best treatment options.  ?

## 2021-08-03 NOTE — Telephone Encounter (Signed)
Patient aware, referral placed at [facility]- oncology they will call to schedule. ?

## 2021-08-08 ENCOUNTER — Telehealth: Payer: Self-pay

## 2021-08-08 NOTE — Telephone Encounter (Signed)
Patient scheduled on 08/26/21 with Dr.Tucker.

## 2021-08-08 NOTE — Telephone Encounter (Signed)
Spoke with Mandy Davis regarding her referral to GYN oncology. She has an appointment scheduled with Dr. Pricilla Holm on June 9 at 9:45a. Patient agrees to date and time. She has been provided with office address and location. She is also aware of our mask and visitor policy. Patient verbalized understanding and will call with any questions.

## 2021-08-22 ENCOUNTER — Encounter: Payer: Self-pay | Admitting: Gynecologic Oncology

## 2021-08-25 NOTE — Progress Notes (Unsigned)
GYNECOLOGIC ONCOLOGY NEW PATIENT CONSULTATION   Patient Name: Mandy Davis  Patient Age: 61 y.o. 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   We reviewed her more recent pap smear history as well as recent biopsies showing low grade dysplasia on ECC and VAIN2 on upper vaginal biopsy. We discussed the role of HPV in the pathogenesis of the majority of cervical and vaginal dysplasia.  With regard to her low grade cervical dysplasia, given the low risk of progression to cancer, this will be followed with close surveillance (cotesting in one year). With regard to her high-grade vaginal dysplasia, given increased risk of progression to cancer, I recommend proceeding with treatment. We discussed options for surgical treatment (excision and laser ablation) as well as topical therapy (eg 5FU). Given diffuse areas that appear involved at the vaginal apex, I recommend that we proceed with vaginal laser ablation.   We also discussed the risk of recurrence after treatment and the need for long-term surveillance with her OBGYN.   Plan will be for EUA, possible vaginal and cervical biopsies, vaginal laser ablation, and any other indicated procedures. We discussed risks of surgery which included but are not limited to bleeding, need for blood transfusion, infection, damage to surrounding structure, anesthesia risk thromboembolic events, unforeseen complications, and medical complications. The patient will receive DVT and antibiotic prophylaxis as indicated. She voiced a clear understanding. She had the opportunity to ask questions. Perioperative instructions were reviewed with her. Prescriptions for post-op medications were sent to her pharmacy of choice.  A  copy of this note was sent to the patient's referring provider.   55 minutes of total time was spent for this patient encounter, including preparation, face-to-face counseling with the patient and coordination of care, and documentation of the encounter.   Eugene Garnet, MD  Division of Gynecologic Oncology  Department of Obstetrics and Gynecology  [facility]  ___________________________________________  Chief Complaint: Chief Complaint  Patient presents with   VAIN III (vaginal intraepithelial neoplasia grade III)    History of Present Illness:  Mandy Davis is a 61 y.o. y.o. female who is seen in consultation at the request of Dr. Oscar La for an evaluation of vaginal dysplasia.  Patient reports remote history of an abnormal Pap smear about 30 years ago that was treated with a conization procedure.  She notes having normal Pap smears since then.  She was widowed in 2015 and had a new sexual partner in 2020.  Her Pap test in 2020 came back abnormal (it sounds like it was HPV positive), the patient was not aware of these results.  The next Pap smear she had was on 07/13/2021. This was LSIL, HR HPV+.  Comment that there are few cells suggestive of a higher grade lesion.  She then underwent colposcopy on 5/8 which was noted to be unsatisfactory.  No acetowhite changes noted.  Decreased Lugol's uptake diffusely, more pronounced on the left vaginal sidewall, biopsy taken at this area.  Cervix needed to be dilated to obtain ECC. 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  She reports overall doing well.  She denies any vaginal bleeding or discharge.  Denies any pelvic pain.  Reports regular bowel and bladder function.  PAST MEDICAL HISTORY:  Past Medical History:  Diagnosis Date   Abnormal Pap smear of cervix    over 30 yrs ago   Amenorrhea      PAST SURGICAL HISTORY:  Past Surgical History:   Procedure Laterality Date   CERVICAL CONE BIOPSY     over 30 yrs ago   GUM SURGERY     WISDOM TOOTH EXTRACTION      OB/GYN HISTORY:  OB History  Gravida Para Term Preterm AB Living  0 0 0 0 0 0  SAB IAB Ectopic Multiple Live Births  0 0 0 0 0    Patient's last menstrual period was 03/09/2011 (exact date).  Age at menarche: [age] Age at menopause: [age] Hx of HRT: Denies Hx of STDs: HPV Last pap: see HPI History of abnormal pap smears: yes  SCREENING STUDIES:  Last mammogram: 04/2021  Last colonoscopy: 07/2013  MEDICATIONS: Outpatient Encounter Medications as of 08/26/2021  Medication Sig   ibuprofen (ADVIL) 800 MG tablet Take 1 tablet (800 mg total) by mouth every 8 (eight) hours as needed for moderate pain. For AFTER surgery only   senna-docusate (SENOKOT-S) 8.6-50 MG tablet Take 2 tablets by mouth at bedtime. For AFTER surgery, do not take if having diarrhea   traMADol (ULTRAM) 50 MG tablet Take 1 tablet (50 mg total) by mouth every 6 (six) hours as needed for severe pain. For AFTER surgery only, do not take and drive   No facility-administered encounter medications on file as of 08/26/2021.    ALLERGIES:  No Known Allergies   FAMILY HISTORY:  Family History  Problem Relation Age of Onset   Anemia Mother    Heart disease Father    Cancer Maternal Aunt    Pancreatic cancer Maternal Aunt    Colon polyps Maternal Grandfather    Breast cancer Paternal Grandmother    Emphysema Paternal Grandfather    Colon cancer Neg Hx    Ovarian cancer Neg Hx    Endometrial cancer Neg Hx    Prostate cancer Neg Hx      SOCIAL HISTORY:  Social Connections: Not on file    REVIEW OF SYSTEMS:  Denies appetite changes, fevers, chills, fatigue, unexplained weight changes. Denies hearing loss, neck lumps or masses, mouth sores, ringing in ears or voice changes. Denies cough or wheezing.  Denies shortness of breath. Denies chest pain or palpitations. Denies leg swelling. Denies abdominal  distention, pain, blood in stools, constipation, diarrhea, nausea, vomiting, or early satiety. Denies pain with intercourse, dysuria, frequency, hematuria or incontinence. Denies hot flashes, pelvic pain, vaginal bleeding or vaginal discharge.   Denies joint pain, back pain or muscle pain/cramps. Denies itching, rash, or wounds. Denies dizziness, headaches, numbness or seizures. Denies swollen lymph nodes or glands, denies easy bruising or bleeding. Denies anxiety, depression, confusion, or decreased concentration.  Physical Exam:  Vital Signs for this encounter:  Blood pressure (!) 143/81, pulse 66, temperature 98.4 F (36.9 C), temperature source Oral, resp. rate 16, height 5\' 10"  (1.778 m), weight 153 lb 6.4 oz (69.6 kg), last menstrual period 03/09/2011, SpO2 100 %. Body mass index is 22.01 kg/m. General: Alert, oriented, no acute distress.  HEENT: Normocephalic, atraumatic. Sclera anicteric.  Chest: Clear to auscultation bilaterally. No wheezes, rhonchi, or rales. Cardiovascular: Regular rate and rhythm, no murmurs, rubs, or gallops.  Abdomen: Normoactive bowel sounds. Soft, nondistended, nontender to palpation. No masses or hepatosplenomegaly appreciated. No palpable fluid wave.  Extremities: Grossly normal range of motion. Warm, well perfused.  No edema bilaterally.  Skin: No rashes or lesions.  Lymphatics: No cervical, supraclavicular, or inguinal adenopathy.  GU:  Normal external female genitalia. No lesions. No discharge or bleeding.             Bladder/urethra:  No lesions or masses, well supported bladder             Vagina: Mildly atrophic.             Cervix: Overall normal-appearing, some changes consistent with prior conization procedure.             Bimanual exam: Deferred.  Coloscopy and vaginoscopy: With the cervix well visualized, Lugol's was applied to the cervix and upper vagina.  Decreased uptake was noted in the less than 1 cm circular area along the right aspect of  the cervix from approximately 7-9 o'clock.  May represent a nabothian cyst.  There is decreased uptake along bilateral upper vaginal sidewalls, more pronounced along the left from approximately 2-4 o'clock.  Biopsy site noted, minimal bleeding with manipulation of the speculum.  LABORATORY AND RADIOLOGIC DATA:  Outside medical records were reviewed to synthesize the above history, along with the history and physical obtained during the visit.   Lab Results  Component Value Date   TSH 0.82 11/19/2015

## 2021-08-26 ENCOUNTER — Encounter: Payer: Self-pay | Admitting: Gynecologic Oncology

## 2021-08-26 ENCOUNTER — Inpatient Hospital Stay: Payer: Commercial Managed Care - PPO | Attending: Gynecologic Oncology | Admitting: Gynecologic Oncology

## 2021-08-26 ENCOUNTER — Other Ambulatory Visit: Payer: Self-pay

## 2021-08-26 VITALS — BP 143/81 | HR 66 | Temp 98.4°F | Resp 16 | Ht 70.0 in | Wt 153.4 lb

## 2021-08-26 DIAGNOSIS — D072 Carcinoma in situ of vagina: Secondary | ICD-10-CM | POA: Diagnosis present

## 2021-08-26 DIAGNOSIS — B977 Papillomavirus as the cause of diseases classified elsewhere: Secondary | ICD-10-CM

## 2021-08-26 DIAGNOSIS — N879 Dysplasia of cervix uteri, unspecified: Secondary | ICD-10-CM

## 2021-08-26 MED ORDER — TRAMADOL HCL 50 MG PO TABS: 50.0000 mg | ORAL_TABLET | Freq: Four times a day (QID) | ORAL | 0 refills | Status: DC | PRN

## 2021-08-26 MED ORDER — SENNOSIDES-DOCUSATE SODIUM 8.6-50 MG PO TABS: 2.0000 | ORAL_TABLET | Freq: Every day | ORAL | 0 refills | Status: DC

## 2021-08-26 MED ORDER — IBUPROFEN 800 MG PO TABS: 800.0000 mg | ORAL_TABLET | Freq: Three times a day (TID) | ORAL | 0 refills | Status: DC | PRN

## 2021-08-26 NOTE — Patient Instructions (Addendum)
Preparing for your Surgery  Plan for surgery with Dr. Jeral Pinch at [facility] on August 31, 2021. You will be scheduled for carbon dioxide laser application to the vagina, cervical biopsy.   Pre-operative Testing -You will receive a phone call from presurgical testing at [facility] to discuss surgery instructions and arrange for lab work if needed.  -Bring your insurance card, copy of an advanced directive if applicable, medication list.  -You should not be taking blood thinners or aspirin at least ten days prior to surgery unless instructed by your surgeon.  -Do not take supplements such as fish oil (omega 3), red yeast rice, turmeric before your surgery. You want to avoid medications with aspirin in them including headache powders such as BC or Goody's), Excedrin migraine.  Day Before Surgery at [facility] will be advised you can have clear liquids up until 3 hours before your surgery.    Your role in recovery Your role is to become active as soon as directed by your doctor, while still giving yourself time to heal.  Rest when you feel tired. You will be asked to do the following in order to speed your recovery:  - Cough and breathe deeply. This helps to clear and expand your lungs and can prevent pneumonia after surgery.  - [place]. Do mild physical activity. Walking or moving your legs help your circulation and body functions return to normal. Do not try to get up or walk alone the first time after surgery.   -If you develop swelling on one leg or the other, pain in the back of your leg, redness/warmth in one of your legs, please call the office or go to the Emergency Room to have a doppler to rule out a blood clot. For shortness of breath, chest pain-seek care in the Emergency Room as soon as possible. - Actively manage your pain. Managing your pain lets you move in comfort. We will ask you to rate your pain on a scale of  zero to 10. It is your responsibility to tell your doctor or nurse where and how much you hurt so your pain can be treated.  Special Considerations -Your final pathology results from surgery should be available around one week after surgery and the results will be relayed to you when available.  -FMLA forms can be faxed to [phone] and please allow 5-7 business days for completion.  Pain Management After Surgery -You will be prescribed your pain medication and bowel regimen medications before surgery so that you can have these available when you are discharged from the hospital. The pain medication is for use ONLY AFTER surgery and a new prescription will not be given.   -Make sure that you have Tylenol and Ibuprofen at home to use on a regular basis after surgery for pain control. We recommend alternating the medications every hour to six hours since they work differently and are processed in the body differently for pain relief.  -Review the attached handout on narcotic use and their risks and side effects.   Bowel Regimen -You will be prescribed Sennakot-S to take nightly to prevent constipation especially if you are taking the narcotic pain medication intermittently.  It is important to prevent constipation and drink adequate amounts of liquids. You can stop taking this medication when you are not taking pain medication and you are back on your normal bowel routine.  Risks of Surgery Risks of surgery are low  but include bleeding, infection, damage to surrounding structures, re-operation, blood clots, and very rarely death.  AFTER SURGERY INSTRUCTIONS  Return to work:  1-2 days if applicable, variable based on occupation  Activity: 1. Be up and out of the bed during the day.  Take a nap if needed.  You may walk up steps but be careful and use the hand rail.  Stair climbing will tire you more than you think, you may need to stop part way and rest.   2. No lifting or straining for 1  week over 10 pounds. No pushing, pulling, straining for 1 week.  3. No driving for minimum 24 hours after surgery.  Do not drive if you are taking narcotic pain medicine and make sure that your reaction time has returned.   4. You can shower as soon as the next day after surgery. Shower daily. No tub baths or submerging your body in water until cleared by your surgeon. If you have the soap that was given to you by pre-surgical testing that was used before surgery, you do not need to use it afterwards because this can irritate your incisions.   5. No sexual activity and nothing in the vagina for 4-6 weeks (will need to be seen for post-op check).  6. You may experience vaginal spotting and discharge after surgery.  The spotting is normal but if you experience heavy bleeding, call our office.  7. Take Tylenol or ibuprofen first for pain if you are able to take these medications and only use narcotic pain medication for severe pain not relieved by the Tylenol or Ibuprofen.  Monitor your Tylenol intake to a max of 4,000 mg in a 24 hour period. You can alternate these medications after surgery.  Diet: 1. Low sodium Heart Healthy Diet is recommended but you are cleared to resume your normal (before surgery) diet after your procedure.  2. It is safe to use a laxative, such as Miralax or Colace, if you have difficulty moving your bowels. You will be prescribed Sennakot at bedtime every evening to keep bowel movements regular and to prevent constipation.    Wound Care: 1. Keep clean and dry.  Shower daily.  Reasons to call the Doctor: Fever - Oral temperature greater than 100.4 degrees Fahrenheit Foul-smelling vaginal discharge Difficulty urinating Nausea and vomiting Increased pain at the site of the incision that is unrelieved with pain medicine. Difficulty breathing with or without chest pain New calf pain especially if only on one side Sudden, continuing increased vaginal bleeding with or  without clots.   Contacts: For questions or concerns you should contact:  Dr. Jeral Pinch at [phone]  Joylene John, NP at [phone]  After Hours: call [phone] and have the GYN Oncologist paged/contacted (after 5 pm or on the weekends).  Messages sent via mychart are for non-urgent matters and are not responded to after hours so for urgent needs, please call the after hours number.

## 2021-08-29 ENCOUNTER — Telehealth: Payer: Self-pay | Admitting: *Deleted

## 2021-08-29 ENCOUNTER — Other Ambulatory Visit: Payer: Self-pay | Admitting: Gynecologic Oncology

## 2021-08-29 DIAGNOSIS — N879 Dysplasia of cervix uteri, unspecified: Secondary | ICD-10-CM

## 2021-08-29 DIAGNOSIS — G8918 Other acute postprocedural pain: Secondary | ICD-10-CM

## 2021-08-29 DIAGNOSIS — D072 Carcinoma in situ of vagina: Secondary | ICD-10-CM

## 2021-08-29 NOTE — Telephone Encounter (Signed)
Spoke with Mandy Davis to inform her that we can move her surgery to June 27th, 2023. The pre op will call her with a time. She verbalized understanding.

## 2021-08-29 NOTE — Telephone Encounter (Signed)
Pt called asking if we can move her sx to June 27th instead of June 20. Informed her we would let the providers know and get back to her. She verbalized understanding.

## 2021-09-07 ENCOUNTER — Other Ambulatory Visit: Payer: Self-pay

## 2021-09-07 ENCOUNTER — Encounter (HOSPITAL_BASED_OUTPATIENT_CLINIC_OR_DEPARTMENT_OTHER): Payer: Self-pay | Admitting: Gynecologic Oncology

## 2021-09-07 NOTE — Progress Notes (Signed)
Spoke w/ via phone for pre-op interview--pt - Lab needs dos---- none             Lab results------ COVID test -----patient states asymptomatic no test needed Arrive at ------- 1215 NPO after MN NO Solid Food.  Clear liquids from MN until---1115  Med rec completed Medications to take morning of surgery -----n/a Diabetic medication -----n/a Patient instructed no nail polish to be worn day of surgery Patient instructed to bring photo id and insurance card day of surgery Patient aware to have Driver (ride ) / caregiver  friend Peg Shaune Pascal  for 24 hours after surgery  Patient Special Instructions -----n/a Pre-Op special Istructions -----n/a Patient verbalized understanding of instructions that were given at this phone interview. Patient denies shortness of breath, chest pain, fever, cough at this phone interview.

## 2021-09-12 ENCOUNTER — Telehealth: Payer: Self-pay

## 2021-09-12 NOTE — Telephone Encounter (Signed)
Telephone call to check on pre-operative status.  Patient compliant with pre-operative instructions.  Reinforced nothing to eat after midnight. Clear liquids until 11:15am. Patient to arrive at 12:15pm.  No questions or concerns voiced.  Instructed to call for any needs.

## 2021-09-13 ENCOUNTER — Ambulatory Visit (HOSPITAL_BASED_OUTPATIENT_CLINIC_OR_DEPARTMENT_OTHER)
Admission: RE | Admit: 2021-09-13 | Discharge: 2021-09-13 | Disposition: A | Discharge status: Home / Self Care | Payer: Commercial Managed Care - PPO | Attending: Gynecologic Oncology | Admitting: Gynecologic Oncology

## 2021-09-13 ENCOUNTER — Ambulatory Visit (HOSPITAL_BASED_OUTPATIENT_CLINIC_OR_DEPARTMENT_OTHER): Payer: Commercial Managed Care - PPO | Admitting: Anesthesiology

## 2021-09-13 ENCOUNTER — Encounter (HOSPITAL_BASED_OUTPATIENT_CLINIC_OR_DEPARTMENT_OTHER)
Admission: RE | Disposition: A | Discharge status: Home / Self Care | Payer: Self-pay | Source: Home / Self Care | Attending: Gynecologic Oncology

## 2021-09-13 ENCOUNTER — Encounter (HOSPITAL_BASED_OUTPATIENT_CLINIC_OR_DEPARTMENT_OTHER): Payer: Self-pay | Admitting: Gynecologic Oncology

## 2021-09-13 ENCOUNTER — Other Ambulatory Visit: Payer: Self-pay

## 2021-09-13 DIAGNOSIS — G8918 Other acute postprocedural pain: Secondary | ICD-10-CM

## 2021-09-13 DIAGNOSIS — N879 Dysplasia of cervix uteri, unspecified: Secondary | ICD-10-CM

## 2021-09-13 DIAGNOSIS — N870 Mild cervical dysplasia: Secondary | ICD-10-CM | POA: Diagnosis not present

## 2021-09-13 DIAGNOSIS — M1990 Unspecified osteoarthritis, unspecified site: Secondary | ICD-10-CM | POA: Insufficient documentation

## 2021-09-13 DIAGNOSIS — D072 Carcinoma in situ of vagina: Secondary | ICD-10-CM | POA: Diagnosis not present

## 2021-09-13 HISTORY — PX: CO2 LASER APPLICATION: SHX5778

## 2021-09-13 HISTORY — PX: LESION REMOVAL: SHX5196

## 2021-09-13 HISTORY — DX: COVID-19: U07.1

## 2021-09-13 SURGERY — EXAM UNDER ANESTHESIA
Anesthesia: General | Site: Vagina

## 2021-09-13 MED ORDER — LACTATED RINGERS IV SOLN
INTRAVENOUS | Status: DC
  Administered 2021-09-13: 1000 mL via INTRAVENOUS

## 2021-09-13 MED ORDER — FENTANYL CITRATE (PF) 100 MCG/2ML IJ SOLN: 25.0000 ug | INTRAMUSCULAR | Status: DC | PRN

## 2021-09-13 MED ORDER — PHENYLEPHRINE 80 MCG/ML (10ML) SYRINGE FOR IV PUSH (FOR BLOOD PRESSURE SUPPORT)
PREFILLED_SYRINGE | INTRAVENOUS | Status: AC
  Filled 2021-09-13: qty 10

## 2021-09-13 MED ORDER — ACETIC ACID 5 % SOLN
Status: AC
  Filled 2021-09-13: qty 50

## 2021-09-13 MED ORDER — LIDOCAINE HCL 1 % IJ SOLN
INTRAMUSCULAR | Status: AC
  Filled 2021-09-13: qty 20

## 2021-09-13 MED ORDER — CELECOXIB 200 MG PO CAPS
ORAL_CAPSULE | ORAL | Status: AC
  Filled 2021-09-13: qty 1

## 2021-09-13 MED ORDER — PHENYLEPHRINE 80 MCG/ML (10ML) SYRINGE FOR IV PUSH (FOR BLOOD PRESSURE SUPPORT)
PREFILLED_SYRINGE | INTRAVENOUS | Status: DC | PRN
  Administered 2021-09-13 (×3): 160 ug via INTRAVENOUS

## 2021-09-13 MED ORDER — EPHEDRINE SULFATE-NACL 50-0.9 MG/10ML-% IV SOSY
PREFILLED_SYRINGE | INTRAVENOUS | Status: DC | PRN
  Administered 2021-09-13: 10 mg via INTRAVENOUS

## 2021-09-13 MED ORDER — ACETAMINOPHEN 500 MG PO TABS
1000.0000 mg | ORAL_TABLET | ORAL | Status: AC
  Administered 2021-09-13: 1000 mg via ORAL

## 2021-09-13 MED ORDER — IODINE STRONG (LUGOLS) 5 % PO SOLN
ORAL | Status: DC | PRN
  Administered 2021-09-13: 1 mL

## 2021-09-13 MED ORDER — FERRIC SUBSULFATE 259 MG/GM EX SOLN
CUTANEOUS | Status: AC
  Filled 2021-09-13: qty 8

## 2021-09-13 MED ORDER — ONDANSETRON HCL 4 MG/2ML IJ SOLN
INTRAMUSCULAR | Status: DC | PRN
  Administered 2021-09-13: 4 mg via INTRAVENOUS

## 2021-09-13 MED ORDER — PROPOFOL 10 MG/ML IV BOLUS
INTRAVENOUS | Status: AC
  Filled 2021-09-13: qty 20

## 2021-09-13 MED ORDER — OXYCODONE HCL 5 MG PO TABS: 5.0000 mg | ORAL_TABLET | Freq: Once | ORAL | Status: DC | PRN

## 2021-09-13 MED ORDER — GABAPENTIN 300 MG PO CAPS
300.0000 mg | ORAL_CAPSULE | ORAL | Status: AC
  Administered 2021-09-13: 300 mg via ORAL

## 2021-09-13 MED ORDER — DEXAMETHASONE SODIUM PHOSPHATE 10 MG/ML IJ SOLN
INTRAMUSCULAR | Status: DC | PRN
  Administered 2021-09-13: 10 mg via INTRAVENOUS

## 2021-09-13 MED ORDER — MIDAZOLAM HCL 2 MG/2ML IJ SOLN
INTRAMUSCULAR | Status: AC
  Filled 2021-09-13: qty 2

## 2021-09-13 MED ORDER — SCOPOLAMINE 1 MG/3DAYS TD PT72
1.0000 | MEDICATED_PATCH | TRANSDERMAL | Status: DC
  Administered 2021-09-13: 1.5 mg via TRANSDERMAL

## 2021-09-13 MED ORDER — FENTANYL CITRATE (PF) 100 MCG/2ML IJ SOLN
INTRAMUSCULAR | Status: DC | PRN
  Administered 2021-09-13: 50 ug via INTRAVENOUS

## 2021-09-13 MED ORDER — MIDAZOLAM HCL 5 MG/5ML IJ SOLN
INTRAMUSCULAR | Status: DC | PRN
  Administered 2021-09-13 (×2): 2 mg via INTRAVENOUS

## 2021-09-13 MED ORDER — IODINE STRONG (LUGOLS) 5 % PO SOLN
ORAL | Status: AC
  Filled 2021-09-13: qty 1

## 2021-09-13 MED ORDER — PROPOFOL 10 MG/ML IV BOLUS
INTRAVENOUS | Status: DC | PRN
  Administered 2021-09-13: 50 mg via INTRAVENOUS
  Administered 2021-09-13: 150 mg via INTRAVENOUS

## 2021-09-13 MED ORDER — ACETIC ACID 5 % SOLN
Status: DC | PRN
  Administered 2021-09-13: 1 via TOPICAL

## 2021-09-13 MED ORDER — FENTANYL CITRATE (PF) 100 MCG/2ML IJ SOLN
INTRAMUSCULAR | Status: AC
  Filled 2021-09-13: qty 2

## 2021-09-13 MED ORDER — OXYCODONE HCL 5 MG/5ML PO SOLN: 5.0000 mg | Freq: Once | ORAL | Status: DC | PRN

## 2021-09-13 MED ORDER — SCOPOLAMINE 1 MG/3DAYS TD PT72
MEDICATED_PATCH | TRANSDERMAL | Status: AC
  Filled 2021-09-13: qty 1

## 2021-09-13 MED ORDER — DEXAMETHASONE SODIUM PHOSPHATE 10 MG/ML IJ SOLN: 4.0000 mg | INTRAMUSCULAR | Status: DC

## 2021-09-13 MED ORDER — LIDOCAINE 2% (20 MG/ML) 5 ML SYRINGE
INTRAMUSCULAR | Status: DC | PRN
  Administered 2021-09-13: 100 mg via INTRAVENOUS

## 2021-09-13 MED ORDER — AMISULPRIDE (ANTIEMETIC) 5 MG/2ML IV SOLN: 10.0000 mg | Freq: Once | INTRAVENOUS | Status: DC | PRN

## 2021-09-13 MED ORDER — BUPIVACAINE HCL (PF) 0.25 % IJ SOLN
INTRAMUSCULAR | Status: AC
  Filled 2021-09-13: qty 30

## 2021-09-13 MED ORDER — CELECOXIB 200 MG PO CAPS
200.0000 mg | ORAL_CAPSULE | ORAL | Status: AC
  Administered 2021-09-13: 200 mg via ORAL

## 2021-09-13 MED ORDER — GABAPENTIN 300 MG PO CAPS
ORAL_CAPSULE | ORAL | Status: AC
  Filled 2021-09-13: qty 1

## 2021-09-13 MED ORDER — ACETAMINOPHEN 500 MG PO TABS
ORAL_TABLET | ORAL | Status: AC
  Filled 2021-09-13: qty 2

## 2021-09-13 MED ORDER — BUPIVACAINE HCL 0.25 % IJ SOLN
INTRAMUSCULAR | Status: DC | PRN
  Administered 2021-09-13: 20 mL

## 2021-09-13 SURGICAL SUPPLY — 44 items
BLADE CLIPPER SENSICLIP SURGIC (BLADE) IMPLANT
BLADE EXTENDED COATED 6.5IN (ELECTRODE) ×1 IMPLANT
BLADE SURG 11 STRL SS (BLADE) IMPLANT
BLADE SURG 15 STRL LF DISP TIS (BLADE) ×3 IMPLANT
BLADE SURG 15 STRL SS (BLADE) ×3
CANISTER SUCT 1200ML W/VALVE (MISCELLANEOUS) IMPLANT
CATH ROBINSON RED A/P 16FR (CATHETERS) ×4 IMPLANT
CNTNR URN SCR LID CUP LEK RST (MISCELLANEOUS) IMPLANT
CONT SPEC 4OZ STRL OR WHT (MISCELLANEOUS) ×9
DEPRESSOR TONGUE BLADE STERILE (MISCELLANEOUS) ×4 IMPLANT
DRSG TELFA 3X8 NADH (GAUZE/BANDAGES/DRESSINGS) IMPLANT
ELECT BALL LEEP 3MM BLK (ELECTRODE) IMPLANT
GAUZE 4X4 16PLY ~~LOC~~+RFID DBL (SPONGE) ×6 IMPLANT
GLOVE BIO SURGEON STRL SZ 6 (GLOVE) ×8 IMPLANT
GOWN STRL REUS W/TWL LRG LVL3 (GOWN DISPOSABLE) ×5 IMPLANT
KIT TURNOVER CYSTO (KITS) ×4 IMPLANT
NDL HYPO 25X1 1.5 SAFETY (NEEDLE) ×3 IMPLANT
NEEDLE HYPO 25X1 1.5 SAFETY (NEEDLE) ×3 IMPLANT
NS IRRIG 500ML POUR BTL (IV SOLUTION) ×4 IMPLANT
PACK PERINEAL COLD (PAD) ×4 IMPLANT
PACK VAGINAL WOMENS (CUSTOM PROCEDURE TRAY) ×4 IMPLANT
PAD DRESSING TELFA 3X8 NADH (GAUZE/BANDAGES/DRESSINGS) IMPLANT
PAD PREP 24X48 CUFFED NSTRL (MISCELLANEOUS) ×4 IMPLANT
PENCIL BUTTON HOLSTER BLD 10FT (ELECTRODE) ×3 IMPLANT
PUNCH BIOPSY DERMAL 3 (INSTRUMENTS) IMPLANT
PUNCH BIOPSY DERMAL 3MM (INSTRUMENTS)
PUNCH BIOPSY DERMAL 4MM (INSTRUMENTS) IMPLANT
SUT VIC AB 0 CT1 36 (SUTURE) IMPLANT
SUT VIC AB 0 SH 27 (SUTURE) ×4 IMPLANT
SUT VIC AB 2-0 CT2 27 (SUTURE) IMPLANT
SUT VIC AB 2-0 SH 27 (SUTURE)
SUT VIC AB 2-0 SH 27XBRD (SUTURE) IMPLANT
SUT VIC AB 3-0 PS2 18 (SUTURE)
SUT VIC AB 3-0 PS2 18XBRD (SUTURE) IMPLANT
SUT VIC AB 3-0 SH 27 (SUTURE) ×3
SUT VIC AB 3-0 SH 27X BRD (SUTURE) ×3 IMPLANT
SUT VIC AB 4-0 PS2 18 (SUTURE) ×4 IMPLANT
SUT VICRYL 0 UR6 27IN ABS (SUTURE) IMPLANT
SWAB OB GYN 8IN STERILE 2PK (MISCELLANEOUS) ×8 IMPLANT
SYR BULB IRRIG 60ML STRL (SYRINGE) ×4 IMPLANT
TOWEL OR 17X26 10 PK STRL BLUE (TOWEL DISPOSABLE) ×7 IMPLANT
TUBE CONNECTING 12X1/4 (SUCTIONS) IMPLANT
VACUUM HOSE 7/8X10 W/ WAND (MISCELLANEOUS) ×1 IMPLANT
WATER STERILE IRR 500ML POUR (IV SOLUTION) ×4 IMPLANT

## 2021-09-13 NOTE — Anesthesia Preprocedure Evaluation (Addendum)
Anesthesia Evaluation  Patient identified by MRN, date of birth, ID band Patient awake    Reviewed: Allergy & Precautions, NPO status , Patient's Chart, lab work & pertinent test results  History of Anesthesia Complications Negative for: history of anesthetic complications  Airway Mallampati: II  TM Distance: >3 FB Neck ROM: Full    Dental no notable dental hx.    Pulmonary neg pulmonary ROS,    Pulmonary exam normal        Cardiovascular negative cardio ROS Normal cardiovascular exam     Neuro/Psych negative neurological ROS  negative psych ROS   GI/Hepatic negative GI ROS, Neg liver ROS,   Endo/Other  negative endocrine ROS  Renal/GU negative Renal ROS  negative genitourinary   Musculoskeletal  (+) Arthritis ,   Abdominal   Peds  Hematology negative hematology ROS (+)   Anesthesia Other Findings Day of surgery medications reviewed with patient.  Reproductive/Obstetrics VAGINAL - CERVICAL DYSPLASIA                            Anesthesia Physical Anesthesia Plan  ASA: 2  Anesthesia Plan: General   Post-op Pain Management: Tylenol PO (pre-op)*, Celebrex PO (pre-op)* and Gabapentin PO (pre-op)*   Induction: Intravenous  PONV Risk Score and Plan: 3 and Treatment may vary due to age or medical condition, Midazolam, Dexamethasone, Ondansetron and Scopolamine patch - Pre-op  Airway Management Planned: LMA  Additional Equipment: None  Intra-op Plan:   Post-operative Plan: Extubation in OR  Informed Consent: I have reviewed the patients History and Physical, chart, labs and discussed the procedure including the risks, benefits and alternatives for the proposed anesthesia with the patient or authorized representative who has indicated his/her understanding and acceptance.     Dental advisory given  Plan Discussed with: CRNA  Anesthesia Plan Comments:       Anesthesia Quick  Evaluation

## 2021-09-13 NOTE — Transfer of Care (Signed)
Immediate Anesthesia Transfer of Care Note  Patient: Marguerite Olea  Procedure(s) Performed: VAGINAL AND CERVICAL BIOPSIES (Vagina ) CO2 LASER APPLICATION (Cervix) WIDE LOCAL EXCISION (Cervix)  Patient Location: PACU  Anesthesia Type:General  Level of Consciousness: drowsy and responds to stimulation  Airway & Oxygen Therapy: Patient Spontanous Breathing  Post-op Assessment: Report given to RN and Post -op Vital signs reviewed and stable  Post vital signs: Reviewed and stable  Last Vitals:  Vitals Value Taken Time  BP 118/69 09/13/21 1353  Temp 36.5 C 09/13/21 1353  Pulse 66 09/13/21 1355  Resp 11 09/13/21 1355  SpO2 98 % 09/13/21 1355  Vitals shown include unvalidated device data.  Last Pain:  Vitals:   09/13/21 1148  TempSrc:   PainSc: 0-No pain      Patients Stated Pain Goal: 5 (09/13/21 1148)  Complications: No notable events documented.

## 2021-09-14 ENCOUNTER — Encounter (HOSPITAL_BASED_OUTPATIENT_CLINIC_OR_DEPARTMENT_OTHER): Payer: Self-pay | Admitting: Gynecologic Oncology

## 2021-09-14 ENCOUNTER — Telehealth: Payer: Self-pay

## 2021-09-14 LAB — SURGICAL PATHOLOGY

## 2021-09-14 NOTE — Telephone Encounter (Signed)
Attempted to reach patient to check in with her post-operatively. Unable to reach patient. Left message requesting return call.   

## 2021-09-14 NOTE — Telephone Encounter (Signed)
Spoke with Mandy Davis this morning. She states she is eating, drinking and urinating well.  She reports going to work today. She has not had a BM yet but is passing gas. She is taking senokot as prescribed and encouraged her to drink plenty of water. She denies fever or chills. She rates her pain 1/10. Her pain is controlled with Ibuprofen.     Instructed to call office with any fever, chills, purulent drainage, uncontrolled pain or any other questions or concerns. Patient verbalizes understanding.   Pt aware of post op appointments as well as the office number [phone] and after hours number [phone] to call if she has any questions or concerns

## 2021-10-26 ENCOUNTER — Encounter: Payer: Self-pay | Admitting: Gynecologic Oncology

## 2021-10-27 ENCOUNTER — Encounter: Payer: Self-pay | Admitting: Gynecologic Oncology

## 2021-10-27 ENCOUNTER — Inpatient Hospital Stay: Payer: Commercial Managed Care - PPO | Attending: Gynecologic Oncology | Admitting: Gynecologic Oncology

## 2021-10-27 ENCOUNTER — Other Ambulatory Visit: Payer: Self-pay

## 2021-10-27 VITALS — BP 142/71 | HR 79 | Temp 98.5°F | Resp 16 | Ht 70.0 in | Wt 155.5 lb

## 2021-10-27 DIAGNOSIS — Z7189 Other specified counseling: Secondary | ICD-10-CM

## 2021-10-27 DIAGNOSIS — Z9889 Other specified postprocedural states: Secondary | ICD-10-CM

## 2021-10-27 DIAGNOSIS — D072 Carcinoma in situ of vagina: Secondary | ICD-10-CM

## 2021-10-27 NOTE — Progress Notes (Signed)
Gynecologic Oncology Return Clinic Visit  10/27/2021  Reason for Visit: Follow-up after recent surgery, treatment recommendations  Treatment History: Patient reports remote history of an abnormal Pap smear about 30 years ago that was treated with a conization procedure.  She notes having normal Pap smears since then.  She was widowed in 2015 and had a new sexual partner in 2020.  Her Pap test in 2020 came back abnormal (it sounds like it was HPV positive), the patient was not aware of these results.  The next Pap smear she had was on 07/13/2021. This was LSIL, HR HPV+.  Comment that there are few cells suggestive of a higher grade lesion.   She then underwent colposcopy on 5/8 which was noted to be unsatisfactory.  No acetowhite changes noted.  Decreased Lugol's uptake diffusely, more pronounced on the left vaginal sidewall, biopsy taken at this area.  Cervix needed to be dilated to obtain ECC. 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  09/13/2021: Exam under anesthesia, cervical biopsy, CO2 laser of the vagina.  Findings at the time of surgery included small and atrophic appearing cervix.  Decreased Lugol's uptake along the left lateral vaginal sidewall near the apex.  Also decreased Lugol's uptake at 9:00 on the cervix (this was biopsied).  Interval History: Patient reports doing well.  Denies any significant pain.  Had discharge for couple days after surgery, denies any discharge or bleeding since.  Reports baseline bowel bladder function.  Past Medical/Surgical History: Past Medical History:  Diagnosis Date   Abnormal Pap smear of cervix    over 30 yrs ago   Amenorrhea    COVID    Oct 2020 cough and fever for 2-3 days.  lost taste and smell for longer all s/s are resolved.   09/07/2021    Past Surgical History:  Procedure Laterality Date   CERVICAL CONE BIOPSY     over 30 yrs ago   CO2 LASER APPLICATION N/A 09/13/2021    Procedure: CO2 LASER APPLICATION;  Surgeon: Carver Fila, MD;  Location: [facility];  Service: Gynecology;  Laterality: N/A;   GUM SURGERY     LESION REMOVAL N/A 09/13/2021   Procedure: WIDE LOCAL EXCISION;  Surgeon: Carver Fila, MD;  Location: [facility];  Service: Gynecology;  Laterality: N/A;   WISDOM TOOTH EXTRACTION      Family History  Problem Relation Age of Onset   Anemia Mother    Heart disease Father    Cancer Maternal Aunt    Pancreatic cancer Maternal Aunt    Colon polyps Maternal Grandfather    Breast cancer Paternal Grandmother    Emphysema Paternal Grandfather    Colon cancer Neg Hx    Ovarian cancer Neg Hx    Endometrial cancer Neg Hx    Prostate cancer Neg Hx     Social History   Socioeconomic History   Marital status: Widowed    Spouse name: Not on file   Number of children: Not on file   Years of education: Not on file   Highest education level: Not on file  Occupational History   Occupation: works for a Systems developer, 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   Social Determinants of Health   Financial Resource Strain: Not on file  Food Insecurity: Not on file  Transportation Needs: Not on file  Physical Activity: Not on file  Stress: Not on file  Social Connections: Not on file    Current Medications:  Current Outpatient Medications:    magnesium 30 MG tablet, Take 30 mg by mouth daily as needed. For muscle cramps,  Disp: , Rfl:    naproxen sodium (ALEVE) 220 MG tablet, Take 220 mg by mouth daily as needed (1-2 tabs daily prn). (Patient not taking: Reported on 13-Nov-2021), Disp: , Rfl:   Review of Systems: Denies appetite changes, fevers, chills, fatigue, unexplained weight changes. Denies hearing loss, neck lumps or masses, mouth sores, ringing in ears or voice changes. Denies cough or wheezing.  Denies shortness of breath. Denies chest pain or palpitations. Denies leg swelling. Denies abdominal distention, pain, blood in stools, constipation, diarrhea, nausea, vomiting, or early satiety. Denies pain with intercourse, dysuria, frequency, hematuria or incontinence. Denies hot flashes, pelvic pain, vaginal bleeding or vaginal discharge.   Denies joint pain, back pain or muscle pain/cramps. Denies itching, rash, or wounds. Denies dizziness, headaches, numbness or seizures. Denies swollen lymph nodes or glands, denies easy bruising or bleeding. Denies anxiety, depression, confusion, or decreased concentration.  Physical Exam: BP (!) 142/71 (BP Location: Left Arm, Patient Position: Sitting)   Pulse 79   Temp 98.5 F (36.9 C) (Oral)   Resp 16   Ht 5\' 10"  (1.778 m)   Wt 155 lb 8 oz (70.5 kg)   LMP 03/09/2011 (Exact Date)   SpO2 100%   BMI 22.31 kg/m  General: Alert, oriented, no acute distress. HEENT: Normocephalic, atraumatic, sclera anicteric. Chest: Labored breathing on room air. Extremities: Grossly normal range of motion.  Warm, well perfused.  No edema bilaterally. Skin: No rashes or lesions noted.  GU: Normal appearing external genitalia without erythema, excoriation, or lesions.  Speculum exam reveals moderately atrophic vaginal mucosa and cervix.  Area treated with laser at left vaginal apex sidewall overall healing well.  No nodularity appreciated on bimanual exam.  Laboratory & Radiologic Studies: A. CERVIX, 9 O'CLOCK, BIOPSY:  - Scant squamous mucosa with metaplastic changes  -  Definite evidence of dysplasia is not seen   Assessment & Plan: Mandy Davis is a 61 y.o. 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  Patient is doing well from a postoperative standpoint.    Reviewed biopsy of the cervix at the time of surgery.  Given risk of recurrence of vaginal dysplasia as well as progression to cancer, close follow-up is recommended.  I have recommended clinical exam every 6 months for 2 years and then annually with her OB/GYN.  Patient should also get annual cotesting.  She would need colposcopy and vaginoscopy for any HPV positive results or abnormal cytology.  18 minutes of total time was spent for this patient encounter, including preparation, face-to-face counseling with the patient and coordination of care, and documentation of the encounter.  46, MD  Division of Gynecologic Oncology  Department of Obstetrics and Gynecology  [facility]

## 2021-10-27 NOTE — Patient Instructions (Signed)
It was good to see you today.  You are healing well from surgery.  For follow-up, I recommend visits every 6 months for the next 2 years with your OB/GYN and then yearly afterwards.  You should also get a Pap test and HPV testing every year.  If you were to develop any new symptoms such as vaginal bleeding or discharge in between visits, please call your OB/GYN to be seen sooner.

## 2021-11-10 ENCOUNTER — Encounter: Payer: Commercial Managed Care - PPO | Admitting: Gynecologic Oncology

## 2022-03-15 ENCOUNTER — Other Ambulatory Visit: Payer: Self-pay | Admitting: Obstetrics and Gynecology

## 2022-03-15 DIAGNOSIS — Z1231 Encounter for screening mammogram for malignant neoplasm of breast: Secondary | ICD-10-CM

## 2022-05-03 ENCOUNTER — Ambulatory Visit
Admission: RE | Admit: 2022-05-03 | Discharge: 2022-05-03 | Disposition: A | Discharge status: Home / Self Care | Payer: Commercial Managed Care - PPO | Source: Ambulatory Visit | Attending: Obstetrics and Gynecology | Admitting: Obstetrics and Gynecology

## 2022-05-03 DIAGNOSIS — Z1231 Encounter for screening mammogram for malignant neoplasm of breast: Secondary | ICD-10-CM

## 2022-05-05 ENCOUNTER — Other Ambulatory Visit: Payer: Self-pay | Admitting: Obstetrics and Gynecology

## 2022-05-05 DIAGNOSIS — R928 Other abnormal and inconclusive findings on diagnostic imaging of breast: Secondary | ICD-10-CM

## 2022-05-17 ENCOUNTER — Ambulatory Visit
Admission: RE | Admit: 2022-05-17 | Discharge: 2022-05-17 | Disposition: A | Discharge status: Home / Self Care | Payer: Commercial Managed Care - PPO | Source: Ambulatory Visit | Attending: Obstetrics and Gynecology | Admitting: Obstetrics and Gynecology

## 2022-05-17 ENCOUNTER — Ambulatory Visit: Payer: Commercial Managed Care - PPO

## 2022-05-17 DIAGNOSIS — R928 Other abnormal and inconclusive findings on diagnostic imaging of breast: Secondary | ICD-10-CM

## 2022-06-21 NOTE — Progress Notes (Signed)
62 y.o. G0P0000 Widowed White or Caucasian Not Hispanic or Latino female here for annual exam.  No vaginal bleeding.   No bowel or bladder c/o. She has mild GSI with a full bladder and sneezing.    In 6/23 she had laser of VAIN III by Dr Mandy Davis, also with CIN I.   Patient's last menstrual period was 03/09/2011 (exact date).          Sexually active: No.  The current method of family planning is post menopausal status.    Exercising: Yes.     Walking and cardio and strength training  Smoker:  no  Health Maintenance: Pap:  06/22/21 LSIL Hr hpv +;  12/11/2018 WNL Hr HPV + 16/18 45 neg, 12-06-17 neg HPV HR neg History of abnormal Pap:  yes, Cone biopsy over 30 years  MMG:  04/27/22 density D bi-rads 1neg  BMD:   none Colonoscopy: 07/22/13 f/u 10 years TDaP:  2015  Gardasil: none    reports that she has never smoked. She has never used smokeless tobacco. She reports current alcohol use of about 14.0 standard drinks of alcohol per week. She reports that she does not use drugs. Works for Insurance claims handler   Past Medical History:  Diagnosis Date   Abnormal Pap smear of cervix    over 30 yrs ago   Amenorrhea    COVID    Oct 2020 cough and fever for 2-3 days.  lost taste and smell for longer all s/s are resolved.   09/07/2021    Past Surgical History:  Procedure Laterality Date   CERVICAL CONE BIOPSY     over 30 yrs ago   CO2 LASER APPLICATION N/A 09/13/2021   Procedure: CO2 LASER APPLICATION;  Surgeon: Mandy Fila, MD;  Location: [facility];  Service: Gynecology;  Laterality: N/A;   GUM SURGERY     LESION REMOVAL N/A 09/13/2021   Procedure: WIDE LOCAL EXCISION;  Surgeon: Mandy Fila, MD;  Location: [facility];  Service: Gynecology;  Laterality: N/A;   WISDOM TOOTH EXTRACTION      Current Outpatient Medications  Medication Sig Dispense Refill   naproxen sodium (ALEVE) 220 MG tablet Take 220 mg by mouth daily as needed (1-2  tabs daily prn).     No current facility-administered medications for this visit.    Family History  Problem Relation Age of Onset   Anemia Mother    Heart disease Father    Cancer Maternal Aunt    Pancreatic cancer Maternal Aunt    Colon polyps Maternal Grandfather    Breast cancer Paternal Grandmother    Emphysema Paternal Grandfather    Colon cancer Neg Hx    Ovarian cancer Neg Hx    Endometrial cancer Neg Hx    Prostate cancer Neg Hx     Review of Systems  All other systems reviewed and are negative.   Exam:   BP 128/66   Pulse 76   Ht 5\' 9"  (1.753 m)   Wt 153 lb (69.4 kg)   LMP 03/09/2011 (Exact Date)   SpO2 100%   BMI 22.59 kg/m   Weight change: @WEIGHTCHANGE @ Height:   Height: 5\' 9"  (175.3 cm)  Ht Readings from Last 3 Encounters:  07/05/22 5\' 9"  (1.753 m)  10/27/21 5\' 10"  (1.778 m)  09/13/21 5\' 10"  (1.778 m)    General appearance: alert, cooperative and appears stated age Head: Normocephalic, without obvious abnormality, atraumatic Neck: no adenopathy, supple,  symmetrical, trachea midline and thyroid normal to inspection and palpation Lungs: clear to auscultation bilaterally Cardiovascular: regular rate and rhythm Breasts: normal appearance, no masses or tenderness Abdomen: soft, non-tender; non distended,  no masses,  no organomegaly Extremities: extremities normal, atraumatic, no cyanosis or edema Skin: Skin color, texture, turgor normal. No rashes or lesions Lymph nodes: Cervical, supraclavicular, and axillary nodes normal. No abnormal inguinal nodes palpated Neurologic: Grossly normal   Pelvic: External genitalia:  no lesions              Urethra:  normal appearing urethra with no masses, tenderness or lesions              Bartholins and Skenes: normal                 Vagina: atrophic appearing vagina with normal color and discharge, no lesions              Cervix: no lesions and stenotic               Bimanual Exam:  Uterus:  normal size,  contour, position, consistency, mobility, non-tender              Adnexa: no mass, fullness, tenderness               Rectovaginal: Confirms               Anus:  normal sphincter tone, no lesions  Mandy Davis, CMA chaperoned for the exam.  1. Well woman exam Discussed breast self exam Discussed calcium and vit D intake Mammogram UTD Colonoscopy next year Labs with primary  2. Vaginal dysplasia - Cytology - PAP -Needs f/u in 6 months  3. Cervical dysplasia - Cytology - PAP  4. Screening for cervical cancer - Cytology - PAP  5. Screening for vaginal cancer - Cytology - PAP

## 2022-06-28 ENCOUNTER — Ambulatory Visit: Payer: Commercial Managed Care - PPO | Admitting: Obstetrics and Gynecology

## 2022-07-05 ENCOUNTER — Ambulatory Visit (INDEPENDENT_AMBULATORY_CARE_PROVIDER_SITE_OTHER): Payer: Commercial Managed Care - PPO | Admitting: Obstetrics and Gynecology

## 2022-07-05 ENCOUNTER — Encounter: Payer: Self-pay | Admitting: Obstetrics and Gynecology

## 2022-07-05 ENCOUNTER — Other Ambulatory Visit: Payer: Self-pay

## 2022-07-05 ENCOUNTER — Other Ambulatory Visit (HOSPITAL_COMMUNITY)
Admission: RE | Admit: 2022-07-05 | Discharge: 2022-07-05 | Disposition: A | Discharge status: Home / Self Care | Payer: Commercial Managed Care - PPO | Source: Ambulatory Visit | Attending: Obstetrics and Gynecology | Admitting: Obstetrics and Gynecology

## 2022-07-05 VITALS — BP 128/66 | HR 76 | Ht 69.0 in | Wt 153.0 lb

## 2022-07-05 DIAGNOSIS — N893 Dysplasia of vagina, unspecified: Secondary | ICD-10-CM | POA: Diagnosis present

## 2022-07-05 DIAGNOSIS — Z124 Encounter for screening for malignant neoplasm of cervix: Secondary | ICD-10-CM | POA: Diagnosis present

## 2022-07-05 DIAGNOSIS — N879 Dysplasia of cervix uteri, unspecified: Secondary | ICD-10-CM | POA: Insufficient documentation

## 2022-07-05 DIAGNOSIS — Z1272 Encounter for screening for malignant neoplasm of vagina: Secondary | ICD-10-CM | POA: Diagnosis present

## 2022-07-05 DIAGNOSIS — Z01419 Encounter for gynecological examination (general) (routine) without abnormal findings: Secondary | ICD-10-CM | POA: Diagnosis not present

## 2022-07-05 NOTE — Patient Instructions (Signed)

## 2022-07-11 LAB — CYTOLOGY - PAP
Comment: NEGATIVE
Diagnosis: HIGH — AB
High risk HPV: POSITIVE — AB

## 2022-07-14 ENCOUNTER — Other Ambulatory Visit: Payer: Self-pay

## 2022-07-14 DIAGNOSIS — R87613 High grade squamous intraepithelial lesion on cytologic smear of cervix (HGSIL): Secondary | ICD-10-CM

## 2022-07-14 DIAGNOSIS — N893 Dysplasia of vagina, unspecified: Secondary | ICD-10-CM

## 2022-07-14 DIAGNOSIS — N882 Stricture and stenosis of cervix uteri: Secondary | ICD-10-CM

## 2022-07-14 MED ORDER — ESTRADIOL 0.1 MG/GM VA CREA: TOPICAL_CREAM | VAGINAL | 0 refills | Status: DC

## 2022-07-14 MED ORDER — MISOPROSTOL 200 MCG PO TABS: ORAL_TABLET | ORAL | 0 refills | Status: DC

## 2022-08-16 ENCOUNTER — Encounter: Payer: Self-pay | Admitting: Obstetrics and Gynecology

## 2022-08-16 ENCOUNTER — Other Ambulatory Visit (HOSPITAL_COMMUNITY)
Admission: RE | Admit: 2022-08-16 | Discharge: 2022-08-16 | Disposition: A | Discharge status: Home / Self Care | Payer: Commercial Managed Care - PPO | Source: Ambulatory Visit | Attending: Obstetrics and Gynecology | Admitting: Obstetrics and Gynecology

## 2022-08-16 ENCOUNTER — Ambulatory Visit (INDEPENDENT_AMBULATORY_CARE_PROVIDER_SITE_OTHER): Payer: Commercial Managed Care - PPO | Admitting: Obstetrics and Gynecology

## 2022-08-16 VITALS — BP 128/84 | HR 77 | Wt 157.0 lb

## 2022-08-16 DIAGNOSIS — N882 Stricture and stenosis of cervix uteri: Secondary | ICD-10-CM

## 2022-08-16 DIAGNOSIS — N893 Dysplasia of vagina, unspecified: Secondary | ICD-10-CM

## 2022-08-16 DIAGNOSIS — R87613 High grade squamous intraepithelial lesion on cytologic smear of cervix (HGSIL): Secondary | ICD-10-CM | POA: Insufficient documentation

## 2022-08-16 DIAGNOSIS — N879 Dysplasia of cervix uteri, unspecified: Secondary | ICD-10-CM

## 2022-08-16 NOTE — Patient Instructions (Signed)

## 2022-08-16 NOTE — Progress Notes (Addendum)
GYNECOLOGY  VISIT   HPI: 62 y.o.   Widowed White or Caucasian Not Hispanic or Latino  female   G0P0000 with Patient's last menstrual period was 03/09/2011 (exact date).   here for evaluation of a HSIL/+HPV pap  In 6/23 she had laser of VAIN III by Dr Pricilla Holm, also with CIN I. Prior cone biopsy over 30 years ago.  She has been pretreated with vaginal estrogen x 1 month and a dose of cytotec (h/o cervical stenosis)  GYNECOLOGIC HISTORY: Patient's last menstrual period was 03/09/2011 (exact date). 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  lost taste and smell for longer all s/s are resolved.   09/07/2021    Past Surgical History:  Procedure Laterality Date   CERVICAL CONE BIOPSY     over 30 yrs ago   CO2 LASER APPLICATION N/A 09/13/2021   Procedure: CO2 LASER APPLICATION;  Surgeon: Carver Fila, MD;  Location: [facility];  Service: Gynecology;  Laterality: N/A;   GUM SURGERY     LESION REMOVAL N/A 09/13/2021   Procedure: WIDE LOCAL EXCISION;  Surgeon: Carver Fila, MD;  [facility];  Service: Gynecology;  Laterality: N/A;   WISDOM TOOTH EXTRACTION      Current Outpatient Medications  Medication Sig Dispense Refill   estradiol (ESTRACE VAGINAL) 0.1 MG/GM vaginal cream Insert 1gm of cream  nightly x 1 week, then 1gm 2x a week for remaining month 42.5 g 0   misoprostol (CYTOTEC) 200 MCG tablet Insert 2 tabs vaginally the night before procedure 2 tablet 0   naproxen sodium (ALEVE) 220 MG tablet Take 220 mg by mouth daily as needed (1-2 tabs daily prn).     No current facility-administered medications for this visit.     ALLERGIES: Patient has no known allergies.  Family History  Problem Relation Age of Onset   Anemia Mother    Heart disease Father    Cancer Maternal Aunt    Pancreatic cancer Maternal Aunt    Colon polyps Maternal Grandfather    Breast cancer Paternal Grandmother    Emphysema Paternal Grandfather    Colon cancer Neg Hx    Ovarian cancer Neg Hx    Endometrial cancer Neg Hx    Prostate cancer Neg Hx     Social History   Socioeconomic History   Marital status: Widowed    Spouse name: Not on file   Number of children: Not on file   Years of education: Not on file  Highest education level: Not on file  Occupational History   Occupation: works for a Systems developer, 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   Social Determinants of Health   Financial Resource Strain: Not on file  Food Insecurity: Not on file  Transportation Needs: Not on file  Physical Activity: Not on file  Stress: Not on file  Social Connections: Not on file  Intimate Partner Violence: Not on file    ROS  PHYSICAL EXAMINATION:    LMP 03/09/2011 (Exact Date)     General appearance: alert,  cooperative and appears stated age  Pelvic: External genitalia:  no lesions              Urethra:  normal appearing urethra with no masses, tenderness or lesions              Bartholins and Skenes: normal                 Vagina: normal appearing vagina with normal color and discharge, no lesions              Cervix: no lesions and very short, almost flush with the vagina , stenotic  Colposcopy: not satisfactory, no aceto-white changes, cervix stenotic. ~ 1 cm area of decreased lugols uptake on the right vaginal side wall, biopsy taken at 9 o'clock. ~1 cm area of decreased lugols uptake just lateral to the cervix, biopsy taken at 1 o'clock. The cervix was very stenotic, needed to place a tenaculum and dilate the cervix with the os finder, then the ECC was able to be performed.  Silver nitrate was needed at the 9 o'clock biopsy site for hemostasis.   Chaperone was present for exam.  Addendum:  1. HGSIL (high grade squamous intraepithelial lesion) on Pap smear of cervix H/O CIN I and laser of VAIN III in 6/23 (with Dr Pricilla Holm) - Colposcopy - Surgical pathology( [place]/ [facility])  2. Cervical dysplasia  3. Vaginal dysplasia  4. Cervical stenosis (uterine cervix) Needed to dilate her cervix. She was pretreated with cytotec.

## 2022-08-19 DIAGNOSIS — D072 Carcinoma in situ of vagina: Secondary | ICD-10-CM

## 2022-08-19 HISTORY — DX: Carcinoma in situ of vagina: D07.2

## 2022-08-23 LAB — SURGICAL PATHOLOGY

## 2022-08-25 ENCOUNTER — Other Ambulatory Visit: Payer: Self-pay

## 2022-08-25 DIAGNOSIS — D072 Carcinoma in situ of vagina: Secondary | ICD-10-CM

## 2022-08-28 ENCOUNTER — Telehealth: Payer: Self-pay | Admitting: *Deleted

## 2022-08-28 NOTE — Telephone Encounter (Signed)
LMOM for the patient to call the office back to schedule a new patient appt  

## 2022-08-28 NOTE — Telephone Encounter (Signed)
Spoke with the patient regarding the referral to GYN oncology. Patient scheduled as new patient with Dr Alvester Morin on 6/11 at 10 am.  Patient given an arrival time of 9:40 am.  Explained to the patient the the doctor will perform a pelvic exam at this visit. Patient given the policy that only one visitor allowed and that visitor must be over 16 yrs are allowed in the Cancer Center. Patient given the address/phone number for the clinic and that the center offers free valet service. Patient aware that masks are option.

## 2022-08-29 ENCOUNTER — Inpatient Hospital Stay (HOSPITAL_BASED_OUTPATIENT_CLINIC_OR_DEPARTMENT_OTHER): Payer: Commercial Managed Care - PPO | Admitting: Gynecologic Oncology

## 2022-08-29 ENCOUNTER — Other Ambulatory Visit: Payer: Self-pay

## 2022-08-29 ENCOUNTER — Inpatient Hospital Stay: Payer: Commercial Managed Care - PPO | Attending: Psychiatry | Admitting: Psychiatry

## 2022-08-29 VITALS — BP 136/83 | HR 62 | Temp 96.5°F | Resp 17 | Ht 69.0 in | Wt 154.5 lb

## 2022-08-29 DIAGNOSIS — D072 Carcinoma in situ of vagina: Secondary | ICD-10-CM | POA: Diagnosis present

## 2022-08-29 DIAGNOSIS — B977 Papillomavirus as the cause of diseases classified elsewhere: Secondary | ICD-10-CM | POA: Diagnosis not present

## 2022-08-29 MED ORDER — TRAMADOL HCL 50 MG PO TABS: 50.0000 mg | ORAL_TABLET | Freq: Four times a day (QID) | ORAL | 0 refills | Status: DC | PRN

## 2022-08-29 MED ORDER — SENNOSIDES-DOCUSATE SODIUM 8.6-50 MG PO TABS: 2.0000 | ORAL_TABLET | Freq: Every day | ORAL | 0 refills | Status: DC

## 2022-08-29 NOTE — H&P (View-Only) (Signed)
Gynecologic Oncology Return Clinic Visit  Date of Service: 08/29/2022 Referring Provider: Jill Jertson, MD  Assessment & Plan: Mandy Davis is a 62 y.o. 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  Reviewed the nature of vaginal dysplasia and its association with HPV.  Reviewed findings from exam that are consistent with recent colposcopy and biopsy.  Reviewed treatment options for vain 3 to include laser ablation and topical treatments.  Given focal and small size of lesion, would recommend laser ablation.  However, we did discuss what topical treatments would entail including application and duration of treatment.  Could consider this in the future.  At this time patient is in agreement with CO2 laser ablation of the vagina.  Patient was consented for: Pelvic exam under anesthesia, CO2 laser ablation of the vagina, possible biopsy on 09/12/22.  The risks of surgery were discussed in detail and she understands these to including but not limited to bleeding requiring a blood transfusion, infection, injury to adjacent organs (including but not limited to the adjacent vaginal tissue, urethra, rectum, bladder, nearby vessels/nerves), thromboembolic events, unforseen complication, and possible need for re-exploration.  If the patient experiences any of these events, she understands that her hospitalization or recovery may be prolonged and that she may need to take additional medications for a prolonged period. The patient will receive DVT and antibiotic prophylaxis as indicated. She voiced a clear understanding. She had the opportunity to ask questions and informed consent was obtained today. She wishes to proceed.  She does not require preoperative clearance. Her METs are >4.  All preoperative instructions were reviewed. Postoperative expectations were also reviewed. Written handouts were provided to the patient.  Okay to continue with vaginal estrogen at this time. Will likely recommend a  short pause following surgery until postop, then can resume.   RTC postop.  Barton Want, MD Gynecologic Oncology   Medical Decision Making I personally spent  TOTAL 45 minutes face-to-face and non-face-to-face in the care of this patient, which includes all pre, intra, and post visit time on the date of service.   ----------------------- Reason for Visit: Vaginal dysplasia  Treatment History: - Remote history of abnormal Pap smear approximately 30 years prior that was treated with conization. - Normal Pap smears following that time. - Widowed in 2015, new sexual partner in 2020. - Abnormal pap in 2020; possibly HPV positive - 07/13/2021 Pap: L SIL, HR HPV+, comment that there are few cells suggestive of higher grade lesion -07/25/2021: Colposcopy: Unsatisfactory; no acetowhite changes; decreased Lugol's uptake diffusely, more pronounced on left vaginal sidewall.  Biopsy at 2:00 with vain 2-3.  ECC CIN-1. -09/13/2021: EUA, cervical biopsy, CO2 laser of the vagina with Dr. Tucker.  Findings: Small and atrophic appearing cervix; decreased Lugol's uptake along the left lateral vaginal sidewall near the apex; decreased Lugol's uptake at 9:00 on the cervix biopsied  Interval History: Patient was last seen by Dr. Tucker on 10/27/2021.  At that time, she recommended clinical exam every 6 months for 2 years then annually with her OB/GYN.  She also recommended annual cotesting.  More recently she was seen by Dr. Jertson on 07/05/2022 at which time a Pap smear was collected.  This returned with H SIL, HPV high-risk positive.  On 08/16/2022 she returned for a colposcopy.  No acetowhite changes of the cervix.  Transformation zone not visualized.  She was noted to have a 1 cm area of decreased Lugol's uptake at the right vaginal sidewall, biopsy taken at 9:00.    Also decreased Lugol's uptake just lateral to the cervix, biopsy at 1:00.  An ECC was also collected.  The 9:00 biopsy returned with VAIN 3.  The  1:00 biopsy was denuded squamous epithelium and the ECC showed CIN-1.  Today, patient denies any bleeding, vaginal discharge, or postcoital bleeding.  She also denies any new vulvar or inguinal lumps.   Past Medical/Surgical History: Past Medical History:  Diagnosis Date   Abnormal Pap smear of cervix    over 30 yrs ago   Amenorrhea    COVID    Oct 2020 cough and fever for 2-3 days.  lost taste and smell for longer all s/s are resolved.   09/07/2021    Past Surgical History:  Procedure Laterality Date   CERVICAL CONE BIOPSY     over 30 yrs ago   CO2 LASER APPLICATION N/A 09/13/2021   Procedure: CO2 LASER APPLICATION;  Surgeon: Tucker, Katherine R, MD;  Location: [facility];  Service: Gynecology;  Laterality: N/A;   GUM SURGERY     LESION REMOVAL N/A 09/13/2021   Procedure: WIDE LOCAL EXCISION;  Surgeon: Tucker, Katherine R, MD;  Location:  SUR[facility]ERY C[facility];  Service: Gynecology;  Laterality: N/A;   WISDOM TOOTH EXTRACTION      Family History  Problem Relation Age of Onset   Anemia Mother    Heart disease Father    Cancer Maternal Aunt    Pancreatic cancer Maternal Aunt    Colon polyps Maternal Grandfather    Breast cancer Paternal Grandmother    Emphysema Paternal Grandfather    Colon cancer Neg Hx    Ovarian cancer Neg Hx    Endometrial cancer Neg Hx    Prostate cancer Neg Hx     Social History   Socioeconomic History   Marital status: Widowed    Spouse name: Not on file   Number of children: Not on file   Years of education: Not on file   Highest education level: Not on file  Occupational History   Occupation: works for a [employer], 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   Social Determinants of Health   Financial Resource Strain: Not on file  Food Insecurity: Not on file  Transportation Needs: Not on file  Physical Activity: Not on file  Stress: Not on file  Social Connections: Not on file    Current Medications:  Current Outpatient Medications:    estradiol (ESTRACE VAGINAL) 0.1 MG/GM vaginal cream, Insert 1gm of cream nightly x 1 week, then 1gm 2x a week for remaining month, Disp: 42.5 g, Rfl: 0   naproxen sodium (ALEVE) 220 MG tablet, Take 220 mg by mouth daily as needed (1-2 tabs daily prn)., Disp: , Rfl:   Review of Symptoms: Complete 10-system review is negative except as above in Interval History.  Physical Exam: BP 136/83 (BP Location: Left Arm, Patient Position: Sitting)   Pulse 62     Temp (!) 96.5 F (35.8 C) (Tympanic)   Resp 17   Ht 5' 9" (1.753 m)   Wt 154 lb 8 oz (70.1 kg)   LMP 03/09/2011 (Exact Date)   SpO2 100%   BMI 22.82 kg/m  General: Alert, oriented, no acute distress. HEENT: Normocephalic, atraumatic. Neck symmetric without masses. Sclera anicteric.  Chest: Normal work of breathing. Clear to auscultation bilaterally.   Cardiovascular: Regular rate and rhythm, no murmurs. Abdomen: Soft, nontender.  Normoactive bowel sounds.  No masses appreciated.   Extremities: Grossly normal range of motion.  Warm, well perfused.  No edema bilaterally. Skin: No rashes or lesions noted. Lymphatics: No cervical, supraclavicular, or inguinal adenopathy. GU: Normal appearing external genitalia without erythema, excoriation, or lesions.  Speculum exam reveals atrophic vaginal mucosa and cervix, evidence of recent vaginal biopsy on right apical vaginal wall.  Bimanual exam reveals normal cervix and uterus. Exam  chaperoned by Melissa Cross, NP   COLPOSCOPY PROCEDURE NOTE  Procedure Details: After appropriate verbal informed consent was obtained, a timeout was performed. A sterile speculum was placed in the vagina. Lugol's solution was applied to the cervix and vagina with the findings as noted below. The speculum was removed from the vagina. The patient tolerated the procedure well.   Adequate Exam: No cervical transformation zone visualized  Biopsy Specimen: none  Condition: Stable. Patient tolerated procedure well.  Complications: None  Findings: decreased uptake at 9 o'clock of vaginal apex (right vaginal wall), approximately 1cm, site of recent biopsy. Additional 3-5mm area at 2-3 o'clock of vaginal apex (left vaginal wall) with slight decreased uptake of Lugol's   Colposcopic Impression: VAIN3 and VAIN1   Laboratory & Radiologic Studies: FINAL MICROSCOPIC DIAGNOSIS:   A. VAGINA, 1 O'CLOCK, BIOPSY:       Largely denuded squamous epithelium.  Detached strips of superficial squamous epithelium without diagnostic  alteration.      Negative for malignancy.   B. 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       See comment.   C. ENDOCERVIX, CURETTAGE:       Low-grade squamous intraepithelial lesion (LSIL / CIN 1).       Endocervical glands without significant diagnostic alteration.       Negative for glandular hyperplasia or malignancy.  

## 2022-08-29 NOTE — Progress Notes (Signed)
Gynecologic Oncology Return Clinic Visit  Date of Service: 08/29/2022 Referring Provider: Gertie Exon, MD  Assessment & Plan: Mandy Davis is a 62 y.o. 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  Reviewed the nature of vaginal dysplasia and its association with HPV.  Reviewed findings from exam that are consistent with recent colposcopy and biopsy.  Reviewed treatment options for vain 3 to include laser ablation and topical treatments.  Given focal and small size of lesion, would recommend laser ablation.  However, we did discuss what topical treatments would entail including application and duration of treatment.  Could consider this in the future.  At this time patient is in agreement with CO2 laser ablation of the vagina.  Patient was consented for: Pelvic exam under anesthesia, CO2 laser ablation of the vagina, possible biopsy on 09/12/22.  The risks of surgery were discussed in detail and she understands these to including but not limited to bleeding requiring a blood transfusion, infection, injury to adjacent organs (including but not limited to the adjacent vaginal tissue, urethra, rectum, bladder, nearby vessels/nerves), thromboembolic events, unforseen complication, and possible need for re-exploration.  If the patient experiences any of these events, she understands that her hospitalization or recovery may be prolonged and that she may need to take additional medications for a prolonged period. The patient will receive DVT and antibiotic prophylaxis as indicated. She voiced a clear understanding. She had the opportunity to ask questions and informed consent was obtained today. She wishes to proceed.  She does not require preoperative clearance. Her METs are >4.  All preoperative instructions were reviewed. Postoperative expectations were also reviewed. Written handouts were provided to the patient.  Okay to continue with vaginal estrogen at this time. Will likely recommend a  short pause following surgery until postop, then can resume.   RTC postop.  [name], MD Gynecologic Oncology   Medical Decision Making I personally spent  TOTAL 45 minutes face-to-face and non-face-to-face in the care of this patient, which includes all pre, intra, and post visit time on the date of service.   ----------------------- Reason for Visit: Vaginal dysplasia  Treatment History: - Remote history of abnormal Pap smear approximately 30 years prior that was treated with conization. - Normal Pap smears following that time. - Widowed in 2015, new sexual partner in 2020. - Abnormal pap in 2020; possibly HPV positive - 07/13/2021 Pap: L SIL, HR HPV+, comment that there are few cells suggestive of higher grade lesion -07/25/2021: Colposcopy: Unsatisfactory; no acetowhite changes; decreased Lugol's uptake diffusely, more pronounced on left vaginal sidewall.  Biopsy at 2:00 with vain 2-3.  ECC CIN-1. -09/13/2021: EUA, cervical biopsy, CO2 laser of the vagina with Dr. Pricilla Holm.  Findings: Small and atrophic appearing cervix; decreased Lugol's uptake along the left lateral vaginal sidewall near the apex; decreased Lugol's uptake at 9:00 on the cervix biopsied  Interval History: Patient was last seen by Dr. Pricilla Holm on 10/27/2021.  At that time, she recommended clinical exam every 6 months for 2 years then annually with her OB/GYN.  She also recommended annual cotesting.  More recently she was seen by Dr. Oscar La on 07/05/2022 at which time a Pap smear was collected.  This returned with H SIL, HPV high-risk positive.  On 08/16/2022 she returned for a colposcopy.  No acetowhite changes of the cervix.  Transformation zone not visualized.  She was noted to have a 1 cm area of decreased Lugol's uptake at the right vaginal sidewall, biopsy taken at 9:00.  Also decreased Lugol's uptake just lateral to the cervix, biopsy at 1:00.  An ECC was also collected.  The 9:00 biopsy returned with VAIN 3.  The  1:00 biopsy was denuded squamous epithelium and the ECC showed CIN-1.  Today, patient denies any bleeding, vaginal discharge, or postcoital bleeding.  She also denies any new vulvar or inguinal lumps.   Past Medical/Surgical History: Past Medical History:  Diagnosis Date   Abnormal Pap smear of cervix    over 30 yrs ago   Amenorrhea    COVID    Oct 2020 cough and fever for 2-3 days.  lost taste and smell for longer all s/s are resolved.   09/07/2021    Past Surgical History:  Procedure Laterality Date   CERVICAL CONE BIOPSY     over 30 yrs ago   CO2 LASER APPLICATION N/A 09/13/2021   Procedure: CO2 LASER APPLICATION;  Surgeon: Carver Fila, MD;  Location: [facility];  Service: Gynecology;  Laterality: N/A;   GUM SURGERY     LESION REMOVAL N/A 09/13/2021   Procedure: WIDE LOCAL EXCISION;  Surgeon: Carver Fila, MD;  Location: [facility];  Service: Gynecology;  Laterality: N/A;   WISDOM TOOTH EXTRACTION      Family History  Problem Relation Age of Onset   Anemia Mother    Heart disease Father    Cancer Maternal Aunt    Pancreatic cancer Maternal Aunt    Colon polyps Maternal Grandfather    Breast cancer Paternal Grandmother    Emphysema Paternal Grandfather    Colon cancer Neg Hx    Ovarian cancer Neg Hx    Endometrial cancer Neg Hx    Prostate cancer Neg Hx     Social History   Socioeconomic History   Marital status: Widowed    Spouse name: Not on file   Number of children: Not on file   Years of education: Not on file   Highest education level: Not on file  Occupational History   Occupation: works for a [employer], 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   Social Determinants of Health   Financial Resource Strain: Not on file  Food Insecurity: Not on file  Transportation Needs: Not on file  Physical Activity: Not on file  Stress: Not on file  Social Connections: Not on file    Current Medications:  Current Outpatient Medications:    estradiol (ESTRACE VAGINAL) 0.1 MG/GM vaginal cream, Insert 1gm of cream nightly x 1 week, then 1gm 2x a week for remaining month, Disp: 42.5 g, Rfl: 0   naproxen sodium (ALEVE) 220 MG tablet, Take 220 mg by mouth daily as needed (1-2 tabs daily prn)., Disp: , Rfl:   Review of Symptoms: Complete 10-system review is negative except as above in Interval History.  Physical Exam: BP 136/83 (BP Location: Left Arm, Patient Position: Sitting)   Pulse 62  Temp (!) 96.5 F (35.8 C) (Tympanic)   Resp 17   Ht 5\' 9"  (1.753 m)   Wt 154 lb 8 oz (70.1 kg)   LMP 03/09/2011 (Exact Date)   SpO2 100%   BMI 22.82 kg/m  General: Alert, oriented, no acute distress. HEENT: Normocephalic, atraumatic. Neck symmetric without masses. Sclera anicteric.  Chest: Normal work of breathing. Clear to auscultation bilaterally.   Cardiovascular: Regular rate and rhythm, no murmurs. Abdomen: Soft, nontender.  Normoactive bowel sounds.  No masses appreciated.   Extremities: Grossly normal range of motion.  Warm, well perfused.  No edema bilaterally. Skin: No rashes or lesions noted. Lymphatics: No cervical, supraclavicular, or inguinal adenopathy. GU: Normal appearing external genitalia without erythema, excoriation, or lesions.  Speculum exam reveals atrophic vaginal mucosa and cervix, evidence of recent vaginal biopsy on right apical vaginal wall.  Bimanual exam reveals normal cervix and uterus. Exam  chaperoned by Warner Mccreedy, NP   COLPOSCOPY PROCEDURE NOTE  Procedure Details: After appropriate verbal informed consent was obtained, a timeout was performed. A sterile speculum was placed in the vagina. Lugol's solution was applied to the cervix and vagina with the findings as noted below. The speculum was removed from the vagina. The patient tolerated the procedure well.   Adequate Exam: No cervical transformation zone visualized  Biopsy Specimen: none  Condition: Stable. Patient tolerated procedure well.  Complications: None  Findings: decreased uptake at 9 o'clock of vaginal apex (right vaginal wall), approximately 1cm, site of recent biopsy. Additional 3-44mm area at 2-3 o'clock of vaginal apex (left vaginal wall) with slight decreased uptake of Lugol's   Colposcopic Impression: VAIN3 and VAIN1   Laboratory & Radiologic Studies: FINAL MICROSCOPIC DIAGNOSIS:   A. VAGINA, 1 O'CLOCK, BIOPSY:       Largely denuded squamous epithelium.  Detached strips of superficial squamous epithelium without diagnostic  alteration.      Negative for malignancy.   B. 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       See comment.   C. ENDOCERVIX, CURETTAGE:       Low-grade squamous intraepithelial lesion (LSIL / CIN 1).       Endocervical glands without significant diagnostic alteration.       Negative for glandular hyperplasia or malignancy.

## 2022-08-29 NOTE — Patient Instructions (Addendum)
Preparing for your Surgery  Plan for surgery on September 12, 2022 with Dr. [name] at [facility] (based on availability). You will be scheduled for examination under anesthesia, vaginal carbon dioxide laser application to the vagina, possible biopsy.   Pre-operative Testing -You will receive a phone call from presurgical testing at [facility] to discuss surgery instructions and arrange for lab work if needed.  -Bring your insurance card, copy of an advanced directive if applicable, medication list.  -You should not be taking blood thinners or aspirin at least ten days prior to surgery unless instructed by your surgeon.  -Do not take supplements such as fish oil (omega 3), red yeast rice, turmeric before your surgery. You want to avoid medications with aspirin in them including headache powders such as BC or Goody's), Excedrin migraine.  Day Before Surgery at Home -You will be advised you can have clear liquids up until 3 hours before your surgery.    Your role in recovery Your role is to become active as soon as directed by your doctor, while still giving yourself time to heal.  Rest when you feel tired. You will be asked to do the following in order to speed your recovery:  - Cough and breathe deeply. This helps to clear and expand your lungs and can prevent pneumonia after surgery.  - STAY ACTIVE WHEN YOU GET HOME. Do mild physical activity. Walking or moving your legs help your circulation and body functions return to normal. Do not try to get up or walk alone the first time after surgery.   -If you develop swelling on one leg or the other, pain in the back of your leg, redness/warmth in one of your legs, please call the office or go to the Emergency Room to have a doppler to rule out a blood clot. For shortness of breath, chest pain-seek care in the Emergency Room as soon as possible. - Actively manage your pain. Managing your pain lets you  move in comfort. We will ask you to rate your pain on a scale of zero to 10. It is your responsibility to tell your doctor or nurse where and how much you hurt so your pain can be treated.  Special Considerations -Your final pathology results from surgery should be available around one week after surgery and the results will be relayed to you when available.  -FMLA forms can be faxed to [phone] and please allow 5-7 business days for completion.  Pain Management After Surgery -You will be prescribed your pain medication and bowel regimen medications before surgery so that you can have these available when you are discharged from the hospital. The pain medication is for use ONLY AFTER surgery and a new prescription will not be given.   -Make sure that you have Tylenol and Ibuprofen OR ALEVE at home IF YOU ARE ABLE TO TAKE THESE MEDICATIONS to use on a regular basis after surgery for pain control. We recommend alternating the medications every hour to six hours since they work differently and are processed in the body differently for pain relief.  -Review the attached handout on narcotic use and their risks and side effects.   Bowel Regimen -You will be prescribed Sennakot-S to take nightly to prevent constipation especially if you are taking the narcotic pain medication intermittently.  It is important to prevent constipation and drink adequate amounts of liquids. You can stop taking this medication when you are not taking pain medication  and you are back on your normal bowel routine.  Risks of Surgery Risks of surgery are low but include bleeding, infection, damage to surrounding structures, re-operation, blood clots, and very rarely death.  AFTER SURGERY INSTRUCTIONS  Return to work:  variable based on occupation  Activity: 1. Be up and out of the bed during the day.  Take a nap if needed.  You may walk up steps but be careful and use the hand rail.  Stair climbing will tire you more  than you think, you may need to stop part way and rest.   2. No lifting or straining for 1 week over 10 pounds. No pushing, pulling, straining for 1 week.  3. No driving for minimum 24 hours after surgery.  Do not drive if you are taking narcotic pain medicine and make sure that your reaction time has returned.   4. You can shower as soon as the next day after surgery. Shower daily. No tub baths or submerging your body in water until cleared by your surgeon. If you have the soap that was given to you by pre-surgical testing that was used before surgery, you do not need to use it afterwards because this can irritate your incisions.   5. No sexual activity and nothing in the vagina for 4-6 weeks.  6. You may experience vaginal spotting and discharge after surgery.  The spotting is normal but if you experience heavy bleeding, call our office.  7. Take Tylenol or ibuprofen OR ALEVE first for pain if you are able to take these medications and only use narcotic pain medication for severe pain not relieved by the Tylenol or Ibuprofen.  Monitor your Tylenol intake to a max of 4,000 mg in a 24 hour period.   Diet: 1. Low sodium Heart Healthy Diet is recommended but you are cleared to resume your normal (before surgery) diet after your procedure.  2. It is safe to use a laxative, such as Miralax or Colace, if you have difficulty moving your bowels. You have been prescribed Sennakot at bedtime every evening to keep bowel movements regular and to prevent constipation.    Wound Care: 1. Keep clean and dry.  Shower daily.  Reasons to call the Doctor: Fever - Oral temperature greater than 100.4 degrees Fahrenheit Foul-smelling vaginal discharge Difficulty urinating Nausea and vomiting Increased pain at the site of the incision that is unrelieved with pain medicine. Difficulty breathing with or without chest pain New calf pain especially if only on one side Sudden, continuing increased vaginal bleeding  with or without clots.   Contacts: For questions or concerns you should contact:  Dr. [name] at [phone]  Warner Mccreedy, NP at [phone]  After Hours: call [phone] and have the GYN Oncologist paged/contacted (after 5 pm or on the weekends).  Messages sent via mychart are for non-urgent matters and are not responded to after hours so for urgent needs, please call the after hours number.

## 2022-08-29 NOTE — Progress Notes (Signed)
Patient here for a pre-operative appointment prior to her scheduled surgery on 09/12/2022. She is scheduled for an examination under anesthesia, vaginal carbon dioxide laser application to the vagina, possible biopsy. The surgery was discussed in detail.  See after visit summary for additional details. Visual aids used to discuss items related to surgery including the incentive spirometer, sequential compression stockings, foley catheter, IV pump, multi-modal pain regimen including tylenol, photo of the surgical robot, female reproductive system to discuss surgery in detail.      Discussed post-op pain management in detail including the aspects of the enhanced recovery pathway.  Advised her that a new prescription would be sent in for Tramadol and it is only to be used for after her upcoming surgery.  We discussed the use of tylenol post-op and to monitor for a maximum of 4,000 mg in a 24 hour period.  Also prescribed sennakot to be used after surgery and to hold if having loose stools.  Discussed bowel regimen in detail.     Discussed the use of SCDs and measures to take at home to prevent DVT including frequent mobility.  Reportable signs and symptoms of DVT discussed. Post-operative instructions discussed and expectations for after surgery. Incisional care discussed as well including reportable signs and symptoms including erythema, drainage, wound separation.     30 minutes spent with the patient.  Verbalizing understanding of material discussed. No needs or concerns voiced at the end of the visit.   Advised patient to call for any needs.  Advised that her post-operative medications had been prescribed and could be picked up at any time.    This appointment is included in the global surgical bundle as pre-operative teaching and has no charge.

## 2022-09-01 ENCOUNTER — Encounter (HOSPITAL_COMMUNITY): Payer: Self-pay

## 2022-09-04 NOTE — Patient Instructions (Signed)
SURGICAL WAITING ROOM VISITATION Patients having surgery or a procedure may have no more than 2 support people in the waiting area - these visitors may rotate.    Children under the age of 27 must have an adult with them who is not the patient.  If the patient needs to stay at the hospital during part of their recovery, the visitor guidelines for inpatient rooms apply. Pre-op nurse will coordinate an appropriate time for 1 support person to accompany patient in pre-op.  This support person may not rotate.    Please refer to the [facility] website for the visitor guidelines for Inpatients (after your surgery is over and you are in a regular room).       Your procedure is scheduled on: 09-12-22   Report to [facility] Main Entrance    Report to admitting at 8:45 AM   Call this number if you have problems the morning of surgery [phone]   Do not eat food :After Midnight.   After Midnight you may have the following liquids until 8:00 AM DAY OF SURGERY           If you have questions, please contact your surgeon's office.   FOLLOW  ANY ADDITIONAL PRE OP INSTRUCTIONS YOU RECEIVED FROM YOUR SURGEON'S OFFICE!!!     Oral Hygiene is also important to reduce your risk of infection.                                    Remember - BRUSH YOUR TEETH THE MORNING OF SURGERY WITH YOUR REGULAR TOOTHPASTE   Do NOT smoke after Midnight   Take these medicines the morning of surgery with A SIP OF WATER:   None                              You may not have any metal on your body including hair pins, jewelry, and body piercing             Do not wear make-up, lotions, powders, perfumes or deodorant  Do not wear nail polish including gel and S&S, artificial/acrylic nails, or any other type of covering on natural nails including finger and toenails. If you have artificial nails, gel coating, etc. that needs to be removed by a nail salon please have this removed prior to surgery or surgery  may need to be canceled/ delayed if the surgeon/ anesthesia feels like they are unable to be safely monitored.   Do not shave  48 hours prior to surgery.    Do not bring valuables to the hospital. [facility] IS NOT RESPONSIBLE   FOR VALUABLES.   Contacts, dentures or bridgework may not be worn into surgery.  DO NOT BRING YOUR HOME MEDICATIONS TO THE HOSPITAL. PHARMACY WILL DISPENSE MEDICATIONS LISTED ON YOUR MEDICATION LIST TO YOU DURING YOUR ADMISSION IN THE HOSPITAL!    Patients discharged on the day of surgery will not be allowed to drive home.  Someone NEEDS to stay with you for the first 24 hours after anesthesia.   Special Instructions: Bring a copy of your healthcare power of attorney and living will documents the day of surgery if you haven't scanned them before.              Please read over the following fact sheets you were given: IF YOU HAVE QUESTIONS ABOUT  YOUR PRE-OP INSTRUCTIONS PLEASE CALL [phone] [place]  If you received a COVID test during your pre-op visit  it is requested that you wear a mask when out in public, stay away from anyone that may not be feeling well and notify your surgeon if you develop symptoms. If you test positive for Covid or have been in contact with anyone that has tested positive in the last 10 days please notify you surgeon.  [facility] - Preparing for Surgery Before surgery, you can play an important role.  Because skin is not sterile, your skin needs to be as free of germs as possible.  You can reduce the number of germs on your skin by washing with CHG (chlorahexidine gluconate) soap before surgery.  CHG is an antiseptic cleaner which kills germs and bonds with the skin to continue killing germs even after washing. Please DO NOT use if you have an allergy to CHG or antibacterial soaps.  If your skin becomes reddened/irritated stop using the CHG and inform your nurse when you arrive at Short Stay. Do not shave (including legs and underarms) for at  least 48 hours prior to the first CHG shower.  You may shave your face/neck.  Please follow these instructions carefully:  1.  Shower with CHG Soap the night before surgery and the  morning of surgery.  2.  If you choose to wash your hair, wash your hair first as usual with your normal  shampoo.  3.  After you shampoo, rinse your hair and body thoroughly to remove the shampoo.                             4.  Use CHG as you would any other liquid soap.  You can apply chg directly to the skin and wash.  Gently with a scrungie or clean washcloth.  5.  Apply the CHG Soap to your body ONLY FROM THE NECK DOWN.   Do   not use on face/ open                           Wound or open sores. Avoid contact with eyes, ears mouth and   genitals (private parts).                       Wash face,  Genitals (private parts) with your normal soap.             6.  Wash thoroughly, paying special attention to the area where your    surgery  will be performed.  7.  Thoroughly rinse your body with warm water from the neck down.  8.  DO NOT shower/wash with your normal soap after using and rinsing off the CHG Soap.                9.  Pat yourself dry with a clean towel.            10.  Wear clean pajamas.            11.  Place clean sheets on your bed the night of your first shower and do not  sleep with pets. Day of Surgery : Do not apply any lotions/deodorants the morning of surgery.  Please wear clean clothes to the hospital/surgery center.  FAILURE TO FOLLOW THESE INSTRUCTIONS MAY RESULT IN THE CANCELLATION OF YOUR SURGERY  PATIENT SIGNATURE_________________________________  NURSE SIGNATURE__________________________________  ________________________________________________________________________

## 2022-09-05 ENCOUNTER — Encounter: Payer: Self-pay | Admitting: Psychiatry

## 2022-09-06 ENCOUNTER — Encounter (HOSPITAL_BASED_OUTPATIENT_CLINIC_OR_DEPARTMENT_OTHER): Payer: Self-pay | Admitting: Psychiatry

## 2022-09-06 NOTE — Progress Notes (Signed)
Spoke w/ via phone for pre-op interview--- pt Lab needs dos----   no            Lab results------ no COVID test -----patient states asymptomatic no test needed Arrive at ------- 1030 on 09-12-2022 NPO after MN NO Solid Food.  Clear liquids from MN until--- 0930 Med rec completed Medications to take morning of surgery ----- none Diabetic medication ----- n/a Patient instructed no nail polish to be worn day of surgery Patient instructed to bring photo id and insurance card day of surgery Patient aware to have Driver (ride ) / caregiver    for 24 hours after surgery  Per pt is still working out whom will be drive/ caregiver.  Pt stated is aware needs name/ number when she checks in in [place]. Patient Special Instructions ----- n/a Pre-Op special Instructions ----- n/a Patient verbalized understanding of instructions that were given at this phone interview. Patient denies shortness of breath, chest pain, fever, cough at this phone interview.

## 2022-09-07 ENCOUNTER — Encounter (HOSPITAL_COMMUNITY)
Admission: RE | Admit: 2022-09-07 | Discharge: 2022-09-07 | Disposition: A | Discharge status: Home / Self Care | Payer: Commercial Managed Care - PPO | Source: Ambulatory Visit | Attending: Internal Medicine | Admitting: Internal Medicine

## 2022-09-07 ENCOUNTER — Encounter (HOSPITAL_COMMUNITY): Payer: Self-pay

## 2022-09-11 ENCOUNTER — Telehealth: Payer: Self-pay | Admitting: Surgery

## 2022-09-11 NOTE — Telephone Encounter (Signed)
Telephone call to check on pre-operative status.  Patient compliant with pre-operative instructions.  Reinforced nothing to eat after midnight. Clear liquids until 9:15am. Patient to arrive at 10:15am. Verified that post-op medications have been sent to patient's preferred pharmacy.  No questions or concerns voiced.  Instructed to call for any needs. 

## 2022-09-12 ENCOUNTER — Ambulatory Visit (HOSPITAL_BASED_OUTPATIENT_CLINIC_OR_DEPARTMENT_OTHER): Payer: Commercial Managed Care - PPO | Admitting: Certified Registered Nurse Anesthetist

## 2022-09-12 ENCOUNTER — Other Ambulatory Visit: Payer: Self-pay

## 2022-09-12 ENCOUNTER — Encounter (HOSPITAL_BASED_OUTPATIENT_CLINIC_OR_DEPARTMENT_OTHER): Payer: Self-pay | Admitting: Psychiatry

## 2022-09-12 ENCOUNTER — Encounter (HOSPITAL_BASED_OUTPATIENT_CLINIC_OR_DEPARTMENT_OTHER)
Admission: RE | Disposition: A | Discharge status: Home / Self Care | Payer: Self-pay | Source: Ambulatory Visit | Attending: Psychiatry

## 2022-09-12 ENCOUNTER — Ambulatory Visit (HOSPITAL_BASED_OUTPATIENT_CLINIC_OR_DEPARTMENT_OTHER)
Admission: RE | Admit: 2022-09-12 | Discharge: 2022-09-12 | Disposition: A | Discharge status: Home / Self Care | Payer: Commercial Managed Care - PPO | Source: Ambulatory Visit | Attending: Psychiatry | Admitting: Psychiatry

## 2022-09-12 DIAGNOSIS — D072 Carcinoma in situ of vagina: Secondary | ICD-10-CM

## 2022-09-12 DIAGNOSIS — M1990 Unspecified osteoarthritis, unspecified site: Secondary | ICD-10-CM | POA: Insufficient documentation

## 2022-09-12 DIAGNOSIS — Z01818 Encounter for other preprocedural examination: Secondary | ICD-10-CM

## 2022-09-12 HISTORY — DX: Personal history of cervical dysplasia: Z87.410

## 2022-09-12 HISTORY — DX: Personal history of vaginal dysplasia: Z87.411

## 2022-09-12 HISTORY — DX: Personal history of other diseases of the female genital tract: Z87.42

## 2022-09-12 HISTORY — DX: Presence of spectacles and contact lenses: Z97.3

## 2022-09-12 HISTORY — PX: CO2 LASER APPLICATION: SHX5778

## 2022-09-12 SURGERY — EXAM UNDER ANESTHESIA
Anesthesia: General | Site: Vagina

## 2022-09-12 MED ORDER — STERILE WATER FOR IRRIGATION IR SOLN
Status: DC | PRN
  Administered 2022-09-12: 1000 mL

## 2022-09-12 MED ORDER — CELECOXIB 200 MG PO CAPS
ORAL_CAPSULE | ORAL | Status: AC
  Filled 2022-09-12: qty 1

## 2022-09-12 MED ORDER — DEXAMETHASONE SODIUM PHOSPHATE 10 MG/ML IJ SOLN
INTRAMUSCULAR | Status: DC | PRN
  Administered 2022-09-12: 10 mg via INTRAVENOUS

## 2022-09-12 MED ORDER — PROPOFOL 10 MG/ML IV BOLUS
INTRAVENOUS | Status: DC | PRN
  Administered 2022-09-12: 200 mg via INTRAVENOUS

## 2022-09-12 MED ORDER — FENTANYL CITRATE (PF) 100 MCG/2ML IJ SOLN
INTRAMUSCULAR | Status: AC
  Filled 2022-09-12: qty 2

## 2022-09-12 MED ORDER — ONDANSETRON HCL 4 MG/2ML IJ SOLN
INTRAMUSCULAR | Status: DC | PRN
  Administered 2022-09-12: 4 mg via INTRAVENOUS

## 2022-09-12 MED ORDER — ACETIC ACID 5 % SOLN
Status: DC | PRN
  Administered 2022-09-12: 1 via TOPICAL

## 2022-09-12 MED ORDER — MIDAZOLAM HCL 2 MG/2ML IJ SOLN
INTRAMUSCULAR | Status: DC | PRN
  Administered 2022-09-12: 2 mg via INTRAVENOUS

## 2022-09-12 MED ORDER — LACTATED RINGERS IV SOLN: INTRAVENOUS | Status: DC

## 2022-09-12 MED ORDER — DEXAMETHASONE SODIUM PHOSPHATE 10 MG/ML IJ SOLN: 4.0000 mg | INTRAMUSCULAR | Status: DC

## 2022-09-12 MED ORDER — SILVER SULFADIAZINE 1 % EX CREA
TOPICAL_CREAM | CUTANEOUS | Status: DC | PRN
  Administered 2022-09-12: 1 via TOPICAL

## 2022-09-12 MED ORDER — FENTANYL CITRATE (PF) 100 MCG/2ML IJ SOLN: 25.0000 ug | INTRAMUSCULAR | Status: DC | PRN

## 2022-09-12 MED ORDER — IODINE STRONG (LUGOLS) 5 % PO SOLN
ORAL | Status: DC | PRN
  Administered 2022-09-12: 1 mL via ORAL

## 2022-09-12 MED ORDER — BUPIVACAINE HCL 0.25 % IJ SOLN
INTRAMUSCULAR | Status: DC | PRN
  Administered 2022-09-12: 5 mL

## 2022-09-12 MED ORDER — CELECOXIB 200 MG PO CAPS
200.0000 mg | ORAL_CAPSULE | ORAL | Status: AC
  Administered 2022-09-12: 200 mg via ORAL

## 2022-09-12 MED ORDER — PROPOFOL 10 MG/ML IV BOLUS
INTRAVENOUS | Status: AC
  Filled 2022-09-12: qty 20

## 2022-09-12 MED ORDER — MIDAZOLAM HCL 2 MG/2ML IJ SOLN
INTRAMUSCULAR | Status: AC
  Filled 2022-09-12: qty 2

## 2022-09-12 MED ORDER — SCOPOLAMINE 1 MG/3DAYS TD PT72
MEDICATED_PATCH | TRANSDERMAL | Status: AC
  Filled 2022-09-12: qty 1

## 2022-09-12 MED ORDER — LIDOCAINE 2% (20 MG/ML) 5 ML SYRINGE
INTRAMUSCULAR | Status: DC | PRN
  Administered 2022-09-12: 60 mg via INTRAVENOUS

## 2022-09-12 MED ORDER — ACETAMINOPHEN 500 MG PO TABS
ORAL_TABLET | ORAL | Status: AC
  Filled 2022-09-12: qty 2

## 2022-09-12 MED ORDER — ACETAMINOPHEN 500 MG PO TABS
1000.0000 mg | ORAL_TABLET | ORAL | Status: AC
  Administered 2022-09-12: 1000 mg via ORAL

## 2022-09-12 MED ORDER — SCOPOLAMINE 1 MG/3DAYS TD PT72
1.0000 | MEDICATED_PATCH | TRANSDERMAL | Status: DC
  Administered 2022-09-12: 1.5 mg via TRANSDERMAL

## 2022-09-12 MED ORDER — FENTANYL CITRATE (PF) 100 MCG/2ML IJ SOLN
INTRAMUSCULAR | Status: DC | PRN
  Administered 2022-09-12: 50 ug via INTRAVENOUS

## 2022-09-12 SURGICAL SUPPLY — 57 items
BLADE CLIPPER SENSICLIP SURGIC (BLADE) IMPLANT
BLADE SURG 11 STRL SS (BLADE) IMPLANT
BLADE SURG 15 STRL LF DISP TIS (BLADE) ×3 IMPLANT
BLADE SURG 15 STRL SS (BLADE) ×2
CATH ROBINSON RED A/P 16FR (CATHETERS) ×2 IMPLANT
DEPRESSOR TONGUE 6 IN STERILE (GAUZE/BANDAGES/DRESSINGS) ×3 IMPLANT
DRAPE UNDERBUTTOCKS STRL (DISPOSABLE) IMPLANT
DRSG TELFA 3X8 NADH STRL (GAUZE/BANDAGES/DRESSINGS) IMPLANT
ELECT BALL LEEP 3MM BLK (ELECTRODE) IMPLANT
GAUZE 4X4 16PLY ~~LOC~~+RFID DBL (SPONGE) IMPLANT
GLOVE BIO SURGEON STRL SZ 6 (GLOVE) ×6 IMPLANT
GLOVE BIOGEL PI IND STRL 6.5 (GLOVE) ×3 IMPLANT
GOWN STRL REUS W/ TWL LRG LVL3 (GOWN DISPOSABLE) ×3 IMPLANT
GOWN STRL REUS W/TWL LRG LVL3 (GOWN DISPOSABLE) ×5 IMPLANT
HEMOSTAT SURGICEL 2X14 (HEMOSTASIS) IMPLANT
HIBICLENS CHG 4% 4OZ BTL (MISCELLANEOUS) ×1 IMPLANT
KIT TURNOVER CYSTO (KITS) ×3 IMPLANT
LEGGING LITHOTOMY PAIR STRL (DRAPES) ×3 IMPLANT
NDL HYPO 22X1.5 SAFETY MO (MISCELLANEOUS) IMPLANT
NDL HYPO 25X1 1.5 SAFETY (NEEDLE) IMPLANT
NDL SPNL 22GX7 QUINCKE BK (NEEDLE) IMPLANT
NEEDLE HYPO 22X1.5 SAFETY MO (MISCELLANEOUS) IMPLANT
NEEDLE HYPO 25X1 1.5 SAFETY (NEEDLE) ×2 IMPLANT
NEEDLE SPNL 22GX7 QUINCKE BK (NEEDLE) ×2 IMPLANT
PACK PERINEAL COLD (PAD) ×3 IMPLANT
PACK VAGINAL MINOR WOMEN LF (CUSTOM PROCEDURE TRAY) ×2 IMPLANT
PACK VAGINAL WOMENS (CUSTOM PROCEDURE TRAY) ×3 IMPLANT
PAD PREP 24X48 CUFFED NSTRL (MISCELLANEOUS) ×3 IMPLANT
PUNCH BIOPSY 3 (MISCELLANEOUS) IMPLANT
PUNCH BIOPSY DERMAL 3 (INSTRUMENTS) IMPLANT
PUNCH BIOPSY DERMAL 3MM (INSTRUMENTS)
PUNCH BIOPSY DERMAL 4MM (INSTRUMENTS) IMPLANT
SCOPETTES 8 STERILE (MISCELLANEOUS) ×4 IMPLANT
SLEEVE SCD COMPRESS KNEE MED (STOCKING) ×3 IMPLANT
SOL PREP POV-IOD 4OZ 10% (MISCELLANEOUS) ×2 IMPLANT
SUT VIC AB 0 CT1 36 (SUTURE) IMPLANT
SUT VIC AB 0 SH 27 (SUTURE) ×3 IMPLANT
SUT VIC AB 2-0 CT2 27 (SUTURE) IMPLANT
SUT VIC AB 2-0 SH 27 (SUTURE)
SUT VIC AB 2-0 SH 27XBRD (SUTURE) IMPLANT
SUT VIC AB 3-0 PS2 18 (SUTURE)
SUT VIC AB 3-0 PS2 18XBRD (SUTURE) IMPLANT
SUT VIC AB 3-0 SH 27 (SUTURE) ×2
SUT VIC AB 3-0 SH 27X BRD (SUTURE) ×3 IMPLANT
SUT VIC AB 4-0 PS2 18 (SUTURE) ×2 IMPLANT
SUT VIC AB 4-0 SH 27 (SUTURE)
SUT VIC AB 4-0 SH 27XANBCTRL (SUTURE) IMPLANT
SUT VICRYL 0 UR6 27IN ABS (SUTURE) IMPLANT
SWAB OB GYN 8IN STERILE 2PK (MISCELLANEOUS) ×1 IMPLANT
SYR 3ML 25GX5/8 SAFETY (SYRINGE) ×3 IMPLANT
SYR BULB IRRIG 60ML STRL (SYRINGE) ×3 IMPLANT
SYR CONTROL 10ML LL (SYRINGE) ×3 IMPLANT
TOWEL OR 17X24 6PK STRL BLUE (TOWEL DISPOSABLE) ×3 IMPLANT
TUBING CONNECTING 10 (TUBING) ×1 IMPLANT
VACUUM HOSE 7/8X10 W/ WAND (MISCELLANEOUS) ×3 IMPLANT
WATER STERILE IRR 1000ML POUR (IV SOLUTION) ×3 IMPLANT
WATER STERILE IRR 500ML POUR (IV SOLUTION) ×2 IMPLANT

## 2022-09-12 NOTE — Op Note (Signed)
GYNECOLOGIC ONCOLOGY OPERATIVE NOTE  Date of Service: 09/12/2022  Preoperative Diagnosis: VAIN3  Postoperative Diagnosis: VAIN3  Procedures: CO2 laser ablation of the vagina  Surgeon: [name], MD  Assistants: None  Anesthesia: General  Estimated Blood Loss: 0 mL    Fluids: 600 ml, crystalloid  Findings: Acetowhite changes and decreased lugol's uptake at 9 o'clock of vaginal apex (right vaginal wall), approximately 1cm, some friability from recent biopsy. No abnormalities visualized elsewhere.  Specimens: * No specimens in log *  Complications:  None  Indications for Procedure: Mandy Davis is a 62 y.o. woman win [place].  Prior to the procedure, all risks, benefits, and alternatives were discussed and informed surgical consent was signed.  Procedure: Patient was taken to the operating room where general anesthesia was achieved.  She was positioned in dorsal lithotomy and prepped and draped.     A speculum was placed in the vagina. A thorough exam of the vagina was done after placing a moist sponge with acetic acid in the vagina, followed by application of Lugol's solution, with the findings as noted above. Decision was made to proceed with laser ablation.  Using the CO2 laser, energy was applied with 8 Watts of continuous power.  The ablation was performed down to a second surgical layer.  The wound bed was hemostatic. Silvadene cream was applied to the wound bed.    Patient tolerated the procedure well. Sponge, lap, and instrument counts were correct.  No perioperative antibiotic prophylaxis was indicated for this procedure.  She was extubated and taken to the PACU in stable condition.  Silvadene cream was provided postoperatively.  [name], MD Gynecologic Oncology

## 2022-09-12 NOTE — Interval H&P Note (Signed)
History and Physical Interval Note:  09/12/2022 12:33 PM  Mandy Davis  has presented today for surgery, with the diagnosis of VAGINAL DYSPLASIA.  The various methods of treatment have been discussed with the patient and family. After consideration of risks, benefits and other options for treatment, the patient has consented to  Procedure(s): EXAM UNDER ANESTHESIA (N/A) CO2 LASER APPLICATION TO VAGINA (N/A) POSSIBLE VAGINAL  BIOPSY (N/A) as a surgical intervention.  The patient's history has been reviewed, patient examined, no change in status, stable for surgery.  I have reviewed the patient's chart and labs.  Questions were answered to the patient's satisfaction.     [name]

## 2022-09-12 NOTE — Discharge Instructions (Addendum)
09/12/2022   Activity: 1. Be up and out of the bed during the day.  Take a nap if needed.  You may walk up steps but be careful and use the hand rail.  Stair climbing will tire you more than you think, you may need to stop part way and rest.   2. Do not drive if you are taking narcotic pain medicine. You need to make sure your reaction time has returned and you can brake safely.  3. Shower daily.  Use your regular soap to bathe and when finished pat your incision dry; don't rub.  No tub baths until cleared by your surgeon.   4. No sexual activity and nothing in the vagina for 2 weeks.  5. You may experience vaginal spotting after surgery. The spotting is normal but if you experience heavy bleeding, call our office.  6. Take Tylenol or ibuprofen first for pain and only use narcotic pain medication for severe pain not relieved by the Tylenol or Ibuprofen.  Monitor your Tylenol intake to a max of 4,000 mg.   Diet: 1. Low sodium Heart Healthy Diet is recommended.  2. It is safe to use a laxative, such as Miralax or Colace, if you have difficulty moving your bowels. You can take Sennakot at bedtime every evening to keep bowel movements regular and to prevent constipation.    Wound Care: 1. Keep clean and dry.  Shower daily.  Reasons to call the Doctor: Fever - Oral temperature greater than 100.4 degrees Fahrenheit Foul-smelling vaginal discharge Difficulty urinating Nausea and vomiting Increased pain at the site of the incision that is unrelieved with pain medicine. Difficulty breathing with or without chest pain New calf pain especially if only on one side Sudden, continuing increased vaginal bleeding with or without clots.   Contacts: For questions or concerns you should contact:  Dr. Alvester Morin at [phone]  Warner Mccreedy, NP at [phone]  After Hours: call [phone] and have the GYN Oncologist paged/contacted   Post Anesthesia Home Care Instructions  Activity: Get  plenty of rest for the remainder of the day. A responsible adult should stay with you for 24 hours following the procedure.  For the next 24 hours, DO NOT: -Drive a car -Advertising copywriter -Drink alcoholic beverages -Take any medication unless instructed by your physician -Make any legal decisions or sign important papers.  Meals: Start with liquid foods such as gelatin or soup. Progress to regular foods as tolerated. Avoid greasy, spicy, heavy foods. If nausea and/or vomiting occur, drink only clear liquids until the nausea and/or vomiting subsides. Call your physician if vomiting continues.  Special Instructions/Symptoms: Your throat may feel dry or sore from the anesthesia or the breathing tube placed in your throat during surgery. If this causes discomfort, gargle with warm salt water. The discomfort should disappear within 24 hours.  If you had a scopolamine patch placed behind your ear for the management of post- operative nausea and/or vomiting:  1. The medication in the patch is effective for 72 hours, after which it should be removed.  Wrap patch in a tissue and discard in the trash. Wash hands thoroughly with soap and water. 2. You may remove the patch earlier than 72 hours if you experience unpleasant side effects which may include dry mouth, dizziness or visual disturbances. 3. Avoid touching the patch. Wash your hands with soap and water after contact with the patch.   May take tylenol after 4:45pm if needed for discomfort.  May take Ibuprofen after  4:45pm if needed for discomfort.

## 2022-09-12 NOTE — Anesthesia Procedure Notes (Signed)
Procedure Name: LMA Insertion Date/Time: 09/12/2022 12:57 PM  Performed by: Francie Massing, CRNAPre-anesthesia Checklist: Patient identified, Emergency Drugs available, Suction available and Patient being monitored Patient Re-evaluated:Patient Re-evaluated prior to induction Oxygen Delivery Method: Circle system utilized Preoxygenation: Pre-oxygenation with 100% oxygen Induction Type: IV induction Ventilation: Mask ventilation without difficulty LMA: LMA inserted LMA Size: 4.0 Number of attempts: 1 Airway Equipment and Method: Bite block Placement Confirmation: positive ETCO2 Tube secured with: Tape Dental Injury: Teeth and Oropharynx as per pre-operative assessment

## 2022-09-12 NOTE — Anesthesia Postprocedure Evaluation (Signed)
Anesthesia Post Note  Patient: Marguerite Olea  Procedure(s) Performed: EXAM UNDER ANESTHESIA (Vagina ) CO2 LASER APPLICATION TO VAGINA (Vagina )     Patient location during evaluation: PACU Anesthesia Type: General Level of consciousness: awake and alert Pain management: pain level controlled Vital Signs Assessment: post-procedure vital signs reviewed and stable Respiratory status: spontaneous breathing, nonlabored ventilation, respiratory function stable and patient connected to nasal cannula oxygen Cardiovascular status: blood pressure returned to baseline and stable Postop Assessment: no apparent nausea or vomiting Anesthetic complications: no  No notable events documented.  Last Vitals:  Vitals:   09/12/22 1408 09/12/22 1445  BP:  121/75  Pulse:  (!) 52  Resp:  19  Temp: 36.4 C   SpO2: 98% 100%    Last Pain:  Vitals:   09/12/22 1445  TempSrc:   PainSc: 0-No pain                 Conard Alvira L Trenden [place]

## 2022-09-12 NOTE — Transfer of Care (Signed)
Immediate Anesthesia Transfer of Care Note  Patient: Marguerite Olea  Procedure(s) Performed: Procedure(s) (LRB): EXAM UNDER ANESTHESIA (N/A) CO2 LASER APPLICATION TO VAGINA (N/A)  Patient Location: PACU  Anesthesia Type: General  Level of Consciousness: awake, oriented, sedated and patient cooperative  Airway & Oxygen Therapy: Patient Spontanous Breathing and Patient connected to face mask oxygen  Post-op Assessment: Report given to PACU RN and Post -op Vital signs reviewed and stable  Post vital signs: Reviewed and stable  Complications: No apparent anesthesia complications Last Vitals:  Vitals Value Taken Time  BP 118/80 09/12/22 1332  Temp 36.4 C 09/12/22 1332  Pulse 60 09/12/22 1332  Resp 11 09/12/22 1332  SpO2 97 % 09/12/22 1332    Last Pain:  Vitals:   09/12/22 1332  TempSrc:   PainSc: 0-No pain      Patients Stated Pain Goal: 4 (09/12/22 1332)  Complications: No notable events documented.

## 2022-09-12 NOTE — Anesthesia Preprocedure Evaluation (Addendum)
Anesthesia Evaluation  Patient identified by MRN, date of birth, ID band Patient awake    Reviewed: Allergy & Precautions, NPO status , Patient's Chart, lab work & pertinent test results  Airway Mallampati: II  TM Distance: >3 FB Neck ROM: Full    Dental no notable dental hx. (+) Teeth Intact, Dental Advisory Given   Pulmonary neg pulmonary ROS   Pulmonary exam normal breath sounds clear to auscultation       Cardiovascular negative cardio ROS Normal cardiovascular exam Rhythm:Regular Rate:Normal     Neuro/Psych negative neurological ROS  negative psych ROS   GI/Hepatic negative GI ROS, Neg liver ROS,,,  Endo/Other  negative endocrine ROS    Renal/GU negative Renal ROS  negative genitourinary   Musculoskeletal  (+) Arthritis ,    Abdominal   Peds  Hematology negative hematology ROS (+)   Anesthesia Other Findings   Reproductive/Obstetrics                             Anesthesia Physical Anesthesia Plan  ASA: 2  Anesthesia Plan: General   Post-op Pain Management: Tylenol PO (pre-op)*   Induction: Intravenous  PONV Risk Score and Plan: 3 and Ondansetron, Dexamethasone and Midazolam  Airway Management Planned: LMA  Additional Equipment:   Intra-op Plan:   Post-operative Plan: Extubation in OR  Informed Consent: I have reviewed the patients History and Physical, chart, labs and discussed the procedure including the risks, benefits and alternatives for the proposed anesthesia with the patient or authorized representative who has indicated his/her understanding and acceptance.     Dental advisory given  Plan Discussed with: CRNA  Anesthesia Plan Comments:        Anesthesia Quick Evaluation

## 2022-09-13 ENCOUNTER — Telehealth: Payer: Self-pay | Admitting: *Deleted

## 2022-09-13 ENCOUNTER — Encounter (HOSPITAL_BASED_OUTPATIENT_CLINIC_OR_DEPARTMENT_OTHER): Payer: Self-pay | Admitting: Psychiatry

## 2022-09-13 NOTE — Telephone Encounter (Signed)
Spoke with Mandy Davis this morning. She states she is eating, drinking and urinating well. She has not had a BM yet but is passing gas. She is taking senokot as prescribed and encouraged her to drink plenty of water. She denies fever or chills. Incisions are dry and intact. She rates her pain 1/10. Her pain is controlled with tylenol.    Instructed to call office with any fever, chills, purulent drainage, uncontrolled pain or any other questions or concerns. Patient verbalizes understanding.   Pt aware of post op appointments as well as the office number [phone] and after hours number [phone] to call if she has any questions or concerns

## 2022-10-16 ENCOUNTER — Inpatient Hospital Stay: Payer: Commercial Managed Care - PPO | Attending: Psychiatry | Admitting: Psychiatry

## 2022-10-16 ENCOUNTER — Other Ambulatory Visit: Payer: Self-pay

## 2022-10-16 VITALS — BP 134/71 | HR 60 | Temp 98.4°F | Resp 20 | Wt 157.8 lb

## 2022-10-16 DIAGNOSIS — D072 Carcinoma in situ of vagina: Secondary | ICD-10-CM | POA: Diagnosis not present

## 2022-10-16 DIAGNOSIS — N893 Dysplasia of vagina, unspecified: Secondary | ICD-10-CM

## 2022-10-16 DIAGNOSIS — Z9889 Other specified postprocedural states: Secondary | ICD-10-CM | POA: Diagnosis not present

## 2022-10-16 DIAGNOSIS — Z7189 Other specified counseling: Secondary | ICD-10-CM

## 2022-10-16 MED ORDER — ESTRADIOL 0.1 MG/GM VA CREA
1.0000 | TOPICAL_CREAM | VAGINAL | 3 refills | Status: DC
Start: 2022-10-16 — End: 2023-01-04

## 2022-10-16 NOTE — Patient Instructions (Signed)
It was a pleasure to see you in clinic today. - Healing well - Continue estrogen - Return visit planned for around April (6 months after your follow-up with Dr. [name]).  Thank you very much for allowing me to provide care for you today.  I appreciate your confidence in choosing our Gynecologic Oncology team at [facility].  If you have any questions about your visit today please call our office or send [place] a MyChart message and we will get back to you as soon as possible.

## 2022-10-16 NOTE — Progress Notes (Unsigned)
Gynecologic Oncology Return Clinic Visit  Date of Service: 10/16/2022 Referring Provider:  Gertie Exon, MD  ***silva new doctor  Assessment & Plan: Mandy Davis is a 62 y.o. woman with Mandy Davis who is s/p CO2 laser of the vagina on 09/12/22.  Postop: - Pt recovering well from surgery and healing appropriately postoperatively - Intraoperative findings and pathology results reviewed. - Ongoing postoperative expectations and precautions reviewed. Continue with no lifting >10lbs through 6 weeks postoperatively - Pt works ***. Okay to return to work at [employer] - Given that uterus is in situ, pt advised that she should continue with pap smear screening per routine until age 50 if she continues with negative/low grade paps.  - Reviewed that after 12 months without menstrual cycles, she should not have any spotting or bleeding.  If this were to occur, she should be evaluated for postmenopausal bleeding.  VAIN3: *** - Reviewed signs/symptoms of recurrence.  - Surveillance reviewed. Follow-up q6 months x2 years then annually.  ***annual co-testing (cotest at 72mo?) Fu with silva October. Follow-up with me 72mo after   ***  ***VTE Prophylaxis: - Khorana score = ***  RTC ***.  [name], MD Gynecologic Oncology   Medical Decision Making I personally spent  TOTAL *** minutes face-to-face and non-face-to-face in the care of this patient, which includes all pre, intra, and post visit time on the date of service.  *** minutes spent reviewing records prior to the visit *** Minutes in patient contact      *** minutes in other billable services *** minutes charting , conferring with consultants etc.   ----------------------- Reason for Visit: Postop***  Treatment History: Oncology History   No history exists.    Interval History: Pt reports that she is recovering well from surgery. She is using *** for pain. She is eating and drinking well. She is voiding without issue and having  regular bowel movements.  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   Wears contact lenses     Past Surgical History:  Procedure Laterality Date   CERVICAL CONE BIOPSY     1980s   CO2 LASER APPLICATION N/A 09/13/2021   Procedure: CO2 LASER APPLICATION;  Surgeon: Carver Fila, MD;  Location: [facility];  Service: Gynecology;  Laterality: N/A;   CO2 LASER APPLICATION N/A 09/12/2022   Procedure: CO2 LASER APPLICATION TO VAGINA;  Surgeon: [name], MD;  Location: [facility] [place];  Service: Gynecology;  Laterality: N/A;   LESION REMOVAL N/A 09/13/2021   Procedure: WIDE LOCAL EXCISION;  Surgeon: Carver Fila, MD;  Location: [facility];  Service: Gynecology;  Laterality: N/A;   WISDOM TOOTH EXTRACTION      Family History  Problem Relation Age of Onset   Anemia Mother    Heart disease Father    Cancer Maternal Aunt    Pancreatic cancer Maternal Aunt    Colon polyps Maternal Grandfather    Breast cancer Paternal Grandmother    Emphysema Paternal Grandfather    Colon cancer Neg Hx    Ovarian cancer Neg Hx    Endometrial cancer Neg Hx  Prostate cancer Neg Hx     Social History   Socioeconomic History   Marital status: Widowed    Spouse name: Not on file   Number of children: Not on file   Years of education: Not on file   Highest education level: Not on file  Occupational History   Occupation: works for a Systems developer, 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   Social Determinants of Health   Financial Resource Strain: Not on file  Food Insecurity: Not on file  Transportation Needs: Not on file  Physical Activity: Not on file  Stress: Not on file  Social Connections: Not on file    Current Medications:  Current Outpatient Medications:    naproxen sodium (ALEVE) 220 MG tablet, Take 220 mg by mouth 2 (two) times daily as needed (pain)., Disp: , Rfl:    Polyethyl Glycol-Propyl Glycol (SYSTANE OP), Place 1 drop into both eyes 2 (two) times daily., Disp: , Rfl:    estradiol (ESTRACE VAGINAL) 0.1 MG/GM vaginal cream, Place 1 Applicatorful vaginally 2 (two) times a week., Disp: 42.5 g, Rfl: 3  Review of Symptoms: Complete 10-system review is negative except as above in Interval History.  Physical Exam: BP 134/71   Pulse 60   Temp 98.4 F (36.9 C)   Resp 20   Wt 157 lb 12.8 oz (71.6 kg)   LMP 03/09/2011 (Exact Date)   SpO2 100%   BMI 23.30 kg/m  General: Alert, oriented, no acute distress. HEENT: Normocephalic, atraumatic. Neck symmetric without masses. Sclera anicteric.  Chest: Normal work of breathing.   Abdomen: Soft, nontender.   Extremities: Grossly normal range of motion.  Warm, well perfused.  No edema bilaterally. Skin: No rashes or lesions noted. GU: Normal appearing external genitalia without erythema, excoriation, or lesions.  Speculum exam reveals healing area of right apical vaginal wall.  Bimanual exam reveals smooth vaginal mucosa. Exam chaperoned by Kimberly [place], CMA   Laboratory & Radiologic Studies: none

## 2022-10-17 ENCOUNTER — Encounter: Payer: Self-pay | Admitting: Psychiatry

## 2022-12-21 NOTE — Progress Notes (Signed)
GYNECOLOGY  VISIT   HPI: 62 y.o.   Widowed  Caucasian  female   G0P0000 with Patient's last menstrual period was 03/09/2011 (exact date).   here for   6 mo follow up.   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 Her colposcopy with Dr. Oscar La 08/16/22 showed ECC - LGSIL, vaginal biopsy - VAIN III.  Patient had CO2 laser ablation of VAIN III on 09/12/22 by Dr. Alvester Morin.   She had prior laser of VAIN  II/III by Dr. Pricilla Holm in 09/13/21.   Hx cone biopsy over 30 years ago.   Denies vaginal bleeding or unusual discharge.  May be some minor external itching, nothing unusual. Occasional lower abdominal pain, extremely random.   Using vaginal estrogen cream 1/2 gram twice a week.   Not sexually active.  Broke up with prior partner, 1.5 years ago.   GYNECOLOGIC HISTORY: Patient's last menstrual period was 03/09/2011 (exact date). 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   Cervical dysplasia 08/26/2021   Personal history of COVID-19 06/22/2021   Baker's cyst of knee 03/24/2015   Osteoarthritis 01/08/2009    Past Medical History:  Diagnosis Date   History of abnormal cervical Pap smear    history multiple abnormal pap smear dating back 1980s   History of cervical dysplasia    CIN 1   History of vaginal dysplasia    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   Wears contact lenses     Past Surgical History:  Procedure Laterality Date   CERVICAL CONE BIOPSY     1980s    CO2 LASER APPLICATION N/A 09/13/2021   Procedure: CO2 LASER APPLICATION;  Surgeon: Carver Fila, MD;  Location: [facility] [place];  Service: Gynecology;  Laterality: N/A;   CO2 LASER APPLICATION N/A 09/12/2022   Procedure: CO2 LASER APPLICATION TO VAGINA;  Surgeon: [name], MD;  Location: [facility] [place];  Service: Gynecology;  Laterality: N/A;   LESION REMOVAL N/A 09/13/2021   Procedure: WIDE LOCAL EXCISION;  Surgeon: Carver Fila, MD;  Location: [facility];  Service: Gynecology;  Laterality: N/A;   WISDOM TOOTH EXTRACTION      Current Outpatient Medications  Medication Sig Dispense Refill   estradiol (ESTRACE VAGINAL) 0.1 MG/GM vaginal cream Place 1 Applicatorful vaginally 2 (two) times a week. 42.5 g 3   naproxen sodium (ALEVE) 220 MG tablet Take 220 mg by mouth 2 (two) times daily as needed (pain).     Polyethyl Glycol-Propyl Glycol (SYSTANE OP) Place 1 drop into both eyes 2 (two) times daily.     No current facility-administered medications for this visit.     ALLERGIES: Patient has no known allergies.  Family History  Problem Relation Age of Onset   Anemia Mother    Heart disease Father    Cancer Maternal Aunt    Pancreatic cancer Maternal Aunt    Colon polyps Maternal Grandfather    Breast cancer Paternal Grandmother    Emphysema Paternal Grandfather    Colon cancer Neg Hx    Ovarian cancer Neg Hx    Endometrial cancer Neg Hx    Prostate cancer Neg Hx     Social History   Socioeconomic History   Marital status: Widowed    Spouse name: Not on file   Number of children: Not on file   Years of education: Not on file   Highest education level: Not on file  Occupational History   Occupation: works for a Systems developer, 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   Social Determinants of Health   Financial Resource Strain: Not on file  Food Insecurity: Not on file  Transportation Needs: Not on file  Physical Activity: Not on file  Stress: Not on file  Social Connections: Not on file  Intimate Partner Violence: Not on file    Review of Systems  All other systems reviewed and are negative.   PHYSICAL EXAMINATION:    BP 120/70 (BP Location: Left Arm, Patient Position: Sitting, Cuff Size: Normal)   Pulse 71   Ht 5' 10.5" (1.791 m)   Wt 160 lb (72.6 kg)   LMP 03/09/2011 (Exact Date)   SpO2 100%   BMI 22.63 kg/m     General appearance: alert, cooperative and appears stated age Inguinal lymph nodes: not enlarged. Pelvic: External genitalia:  no lesions              Urethra:  normal appearing urethra with no masses, tenderness or lesions              Bartholins and Skenes: normal                 Vagina: normal appearing vagina with normal color and discharge, no lesions              Cervix: no lesions.  Estrogen cream removed from the vagina and cervix using water moistened QTip.                 Bimanual Exam:  Uterus:  normal size, contour, position, consistency, mobility, non-tender              Adnexa: no mass, fullness, tenderness       Chaperone was present for exam:  Warren Lacy, CMA  ASSESSMENT  Hx CIN I.  Hx VAIN III.  Hx positive HR HPV. Status post CO2 laser treatment of high grade vaginal dysplasia x 2.  Status post cold knife conization of cervix in remote past.   PLAN  Pap and HR HPV collected today. Patient will have follow up every 6 months for 2 years  and then yearly.  She will  have cotesting at 6 and 12 months post op.  If negative, she will return to annual cytology or cotesting every 2 - 3 years.  Continue vaginal estrogen.  Refills given.  Plan for follow up with Dr. Alvester Morin in 6 months.  After her visit was complete, I also noticed she is due for her routine annual exam in 6 months.   35 min  total time was spent for this patient encounter, including preparation, face-to-face counseling with the patient, coordination of care, and documentation of the encounter.

## 2023-01-04 ENCOUNTER — Encounter: Payer: Self-pay | Admitting: Obstetrics and Gynecology

## 2023-01-04 ENCOUNTER — Ambulatory Visit (INDEPENDENT_AMBULATORY_CARE_PROVIDER_SITE_OTHER): Payer: Commercial Managed Care - PPO | Admitting: Obstetrics and Gynecology

## 2023-01-04 ENCOUNTER — Other Ambulatory Visit (HOSPITAL_COMMUNITY)
Admission: RE | Admit: 2023-01-04 | Discharge: 2023-01-04 | Disposition: A | Discharge status: Home / Self Care | Payer: Commercial Managed Care - PPO | Source: Ambulatory Visit | Attending: Obstetrics and Gynecology | Admitting: Obstetrics and Gynecology

## 2023-01-04 VITALS — BP 120/70 | HR 71 | Ht 70.5 in | Wt 160.0 lb

## 2023-01-04 DIAGNOSIS — D072 Carcinoma in situ of vagina: Secondary | ICD-10-CM | POA: Diagnosis not present

## 2023-01-04 DIAGNOSIS — N870 Mild cervical dysplasia: Secondary | ICD-10-CM

## 2023-01-04 DIAGNOSIS — R87810 Cervical high risk human papillomavirus (HPV) DNA test positive: Secondary | ICD-10-CM

## 2023-01-04 DIAGNOSIS — N893 Dysplasia of vagina, unspecified: Secondary | ICD-10-CM

## 2023-01-04 MED ORDER — ESTRADIOL 0.1 MG/GM VA CREA: TOPICAL_CREAM | VAGINAL | 1 refills | Status: DC

## 2023-01-09 LAB — CYTOLOGY - PAP
Comment: NEGATIVE
Diagnosis: NEGATIVE
High risk HPV: NEGATIVE

## 2023-04-04 ENCOUNTER — Other Ambulatory Visit: Payer: Self-pay | Admitting: Internal Medicine

## 2023-04-04 DIAGNOSIS — Z1231 Encounter for screening mammogram for malignant neoplasm of breast: Secondary | ICD-10-CM

## 2023-04-21 HISTORY — PX: MENISCUS REPAIR: SHX5179

## 2023-05-17 ENCOUNTER — Ambulatory Visit
Admission: RE | Admit: 2023-05-17 | Discharge: 2023-05-17 | Disposition: A | Discharge status: Home / Self Care | Payer: Commercial Managed Care - PPO | Source: Ambulatory Visit | Attending: Internal Medicine | Admitting: Internal Medicine

## 2023-05-17 DIAGNOSIS — Z1231 Encounter for screening mammogram for malignant neoplasm of breast: Secondary | ICD-10-CM

## 2023-06-07 ENCOUNTER — Encounter: Payer: Self-pay | Admitting: Internal Medicine

## 2023-07-09 ENCOUNTER — Ambulatory Visit (AMBULATORY_SURGERY_CENTER): Admitting: *Deleted

## 2023-07-09 VITALS — Ht 70.0 in | Wt 155.0 lb

## 2023-07-09 DIAGNOSIS — Z1211 Encounter for screening for malignant neoplasm of colon: Secondary | ICD-10-CM

## 2023-07-09 MED ORDER — NA SULFATE-K SULFATE-MG SULF 17.5-3.13-1.6 GM/177ML PO SOLN
1.0000 | Freq: Once | ORAL | 0 refills | Status: AC
Start: 2023-07-09 — End: 2023-07-09

## 2023-07-09 NOTE — Progress Notes (Signed)
 Pre visit completed over telephone. Instructions forwarded through MyChart.  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   No egg or soy allergy known to patient  No issues known to pt with past sedation with any surgeries or procedures Patient denies ever being told they had issues or difficulty with intubation  No FH of Malignant Hyperthermia Pt is not on diet pills Pt is not on  home 02  Pt is not on blood thinners  Pt denies issues with constipation  No A fib or A flutter Have any cardiac testing pending--NO Pt instructed to use [email or web address] or GoodRx for a price reduction on prep

## 2023-07-13 NOTE — Progress Notes (Unsigned)
 63 y.o. G0P0000 Widowed Caucasian non-Hispanic female here for annual exam with repeat pap.   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 Her colposcopy with Dr. Jertson 08/16/22 showed ECC - LGSIL, vaginal biopsy - VAIN III.   Patient had CO2 laser ablation of VAIN III on 09/12/22 by Dr. Daisey Dryer.    She had prior laser of VAIN  II/III by Dr. Orvil Bland in 09/13/21.    Hx cone biopsy over 30 years ago.   Last pap 01/04/23 normal, neg HR HPV.  Some constipation.   Recovering from left knee surgery.  Had torn meniscus.   Works in heating and cooling.  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    PCP: Tisovec, Richard W, MD   Patient's last menstrual period was 03/09/2011 (exact date).           Sexually active: No.  The current method of family planning is post menopausal status.    Menopausal hormone therapy: Estrace  Vaginal Cream Exercising: No.   Nothing recently due to meniscal tear in knee but plans to return.  Smoker:  no  OB History  Gravida Para Term Preterm AB Living  0 0 0 0 0 0  SAB IAB Ectopic Multiple Live Births  0 0 0 0 0     HEALTH MAINTENANCE: Last 2 paps: 06/2022-HGSIL, HPV+, 01/04/2023-WNL, HPV- neg History of abnormal Pap or positive HPV: yes, Cone biopsy over 30 years ago, Pap 2020-WNL, HPV+ (HPV 16, 18/45)-neg, 2023-LGSIL, HPV+, Colpo 07/25/2021-VAIN 3, PAP 07/05/2022-HGSIL, HPV+, CO2 laser w/ Onc 09/12/2022 Mammogram: 05/17/2023-WNL, Cat D Colonoscopy: 07/22/2013, Due 07/2023, scheduled to see GI next month Bone Density: n/a  Result n/a  Immunization History  Administered Date(s) Administered   Influenza Split 01/19/2009, 12/23/2010, 12/23/2012, 01/02/2014   Influenza,inj,Quad PF,6+ Mos 12/23/2012, 12/30/2013   Influenza-Unspecified 01/08/2015, 01/21/2016, 02/11/2021, 01/03/2022   Td 09/18/1994   Tdap 03/20/2004, 01/23/2005, 01/19/2009, 01/18/2014   Zoster Recombinant(Shingrix) 03/10/2017, 07/18/2017      reports that she has never smoked. She has never used smokeless  tobacco. She reports current alcohol use of about 14.0 standard drinks of alcohol per week. She reports that she does not use drugs.  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   Wears contact lenses     Past Surgical History:  Procedure Laterality Date   CERVICAL CONE BIOPSY     1980s   CO2 LASER APPLICATION N/A 09/13/2021   Procedure: CO2 LASER APPLICATION;  Surgeon: Suzi Essex, MD;  Location: [facility];  Service: Gynecology;  Laterality: N/A;   CO2 LASER APPLICATION N/A 09/12/2022   Procedure: CO2 LASER APPLICATION TO VAGINA;  Surgeon: Derrel Flies, MD;  Location: [facility] [place];  Service: Gynecology;  Laterality: N/A;   LESION REMOVAL N/A 09/13/2021   Procedure: WIDE LOCAL EXCISION;  Surgeon: Suzi Essex, MD;  Location: [facility];  Service: Gynecology;  Laterality: N/A;   MENISCUS REPAIR Left 04/2023   WISDOM TOOTH EXTRACTION      Current Outpatient Medications  Medication Sig Dispense Refill   Acetaminophen  Extra Strength 500 MG CAPS Take 2 capsules by mouth every 8 (eight) hours.     estradiol  (ESTRACE  VAGINAL) 0.1 MG/GM vaginal cream Place 1/2  gram per vagina at hs twice a week. 42.5 g 1   meloxicam (MOBIC) 15 MG tablet Take 15 mg by mouth daily as needed.     naproxen sodium (ALEVE) 220 MG tablet Take 220 mg by mouth 2 (two) times daily as needed (pain).     Polyethyl Glycol-Propyl Glycol (SYSTANE OP) Place 1 drop into both eyes 2 (two) times daily.     No current facility-administered medications for this visit.    ALLERGIES: Patient has no known allergies.  Family History  Problem Relation Age of Onset   Anemia Mother    Heart disease Father    Cancer  Maternal Aunt    Pancreatic cancer Maternal Aunt    Colon polyps Maternal Grandfather    Breast cancer Paternal Grandmother    Emphysema Paternal Grandfather    Colon cancer Neg Hx    Ovarian cancer Neg Hx    Endometrial cancer Neg Hx    Prostate cancer Neg Hx     Review of Systems  All other systems reviewed and are negative.   PHYSICAL EXAM:  LMP 03/09/2011 (Exact Date)     General appearance: alert, cooperative and appears stated age Head: normocephalic, without obvious abnormality, atraumatic Neck: no adenopathy, supple, symmetrical, trachea midline and thyroid normal to inspection and palpation Lungs: clear to auscultation bilaterally Breasts: normal appearance, no masses or tenderness, No nipple retraction or dimpling, No nipple discharge or bleeding, No axillary adenopathy Heart: regular rate and rhythm Abdomen: soft, non-tender; no masses, no organomegaly Extremities: extremities normal, atraumatic, no cyanosis or edema Skin: skin color, texture, turgor normal. No rashes or lesions Lymph nodes: cervical, supraclavicular, and axillary nodes normal. Neurologic: grossly normal  Pelvic: External genitalia:  no lesions              No abnormal inguinal nodes palpated.              Urethra:  normal appearing urethra with no masses, tenderness or lesions              Bartholins and Skenes: normal                 Vagina: normal appearing vagina with normal color and discharge, no lesions              Cervix: no lesions              Pap taken: {yes no:314532} Bimanual Exam:  Uterus:  normal size, contour, position, consistency, mobility, non-tender              Adnexa: no mass, fullness, tenderness              Rectal exam: {yes no:314532}.  Confirms.              Anus:  normal sphincter tone, no lesions  Chaperone was present for exam:  {BSCHAPERONE:31226::"Emily F, CMA"}  ASSESSMENT: Well woman visit with gynecologic exam. Cervical cancer screening.  Hx CIN I.  Hx VAIN  III.  Hx positive HR HPV. Status post CO2 laser treatment of high grade vaginal dysplasia x 2.  Status post cold knife conization of cervix in remote past.  PHQ-9: ***  ***  PLAN: Mammogram screening discussed. Self breast awareness reviewed. Pap and HRV collected:  {yes no:314532} Guidelines for Calcium, Vitamin D, regular exercise program including cardiovascular and weight bearing exercise. Medication refills:  *** {LABS (Optional):23779} Follow up:  ***    Additional counseling given.  {yes [id number]. ***  total  time was spent for this patient encounter, including preparation, face-to-face counseling with the patient, coordination of care, and documentation of the encounter in addition to doing the well woman visit with gynecologic exam.

## 2023-07-16 ENCOUNTER — Other Ambulatory Visit (HOSPITAL_COMMUNITY)
Admission: RE | Admit: 2023-07-16 | Discharge: 2023-07-16 | Disposition: A | Discharge status: Home / Self Care | Source: Ambulatory Visit | Attending: Obstetrics and Gynecology | Admitting: Obstetrics and Gynecology

## 2023-07-16 ENCOUNTER — Ambulatory Visit (INDEPENDENT_AMBULATORY_CARE_PROVIDER_SITE_OTHER): Payer: Commercial Managed Care - PPO | Admitting: Obstetrics and Gynecology

## 2023-07-16 ENCOUNTER — Encounter: Payer: Self-pay | Admitting: Obstetrics and Gynecology

## 2023-07-16 VITALS — BP 120/82 | HR 66 | Ht 69.0 in | Wt 160.0 lb

## 2023-07-16 DIAGNOSIS — Z1272 Encounter for screening for malignant neoplasm of vagina: Secondary | ICD-10-CM

## 2023-07-16 DIAGNOSIS — Z124 Encounter for screening for malignant neoplasm of cervix: Secondary | ICD-10-CM | POA: Insufficient documentation

## 2023-07-16 DIAGNOSIS — N893 Dysplasia of vagina, unspecified: Secondary | ICD-10-CM

## 2023-07-16 DIAGNOSIS — Z01419 Encounter for gynecological examination (general) (routine) without abnormal findings: Secondary | ICD-10-CM | POA: Diagnosis not present

## 2023-07-16 DIAGNOSIS — Z1331 Encounter for screening for depression: Secondary | ICD-10-CM

## 2023-07-16 MED ORDER — ESTRADIOL 0.1 MG/GM VA CREA
TOPICAL_CREAM | VAGINAL | 2 refills | Status: AC
Start: 2023-07-16 — End: ?

## 2023-07-16 NOTE — Patient Instructions (Signed)

## 2023-07-22 LAB — CYTOLOGY - PAP
Comment: NEGATIVE
Diagnosis: NEGATIVE
High risk HPV: NEGATIVE

## 2023-07-24 ENCOUNTER — Encounter: Payer: Self-pay | Admitting: Obstetrics and Gynecology

## 2023-07-24 ENCOUNTER — Encounter: Payer: Self-pay | Admitting: Internal Medicine

## 2023-07-30 ENCOUNTER — Encounter (HOSPITAL_COMMUNITY): Payer: Self-pay

## 2023-08-06 ENCOUNTER — Ambulatory Visit: Admitting: Internal Medicine

## 2023-08-06 ENCOUNTER — Encounter: Payer: Self-pay | Admitting: Internal Medicine

## 2023-08-06 VITALS — BP 114/78 | HR 60 | Temp 97.9°F | Resp 16 | Ht 70.0 in | Wt 155.0 lb

## 2023-08-06 DIAGNOSIS — Z1211 Encounter for screening for malignant neoplasm of colon: Secondary | ICD-10-CM

## 2023-08-06 MED ORDER — SODIUM CHLORIDE 0.9 % IV SOLN
500.0000 mL | INTRAVENOUS | Status: DC
Start: 2023-08-06 — End: 2023-08-06

## 2023-08-06 NOTE — Progress Notes (Signed)
 Pt A/O x 3, gd SR's, pleased with anesthesia, report to RN

## 2023-08-06 NOTE — Op Note (Signed)
 [facility] Patient Name: Mandy Davis Procedure Date: 08/06/2023 1:34 PM MRN: [id number] Endoscopist: Murel Arlington. Elvin Hammer , MD, [id number] Age: 63 Referring MD:  Date of Birth: 05/09/1960 Gender: Female Account #: [id number] Procedure:                Colonoscopy Indications:              Screening for colorectal malignant neoplasm. Index                            exam 2015 was normal Medicines:                Monitored Anesthesia Care Procedure:                Pre-Anesthesia Assessment:                           - Prior to the procedure, a History and Physical                            was performed, and patient medications and                            allergies were reviewed. The patient's tolerance of                            previous anesthesia was also reviewed. The risks                            and benefits of the procedure and the sedation                            options and risks were discussed with the patient.                            All questions were answered, and informed consent                            was obtained. Prior Anticoagulants: The patient has                            taken no anticoagulant or antiplatelet agents. ASA                            Grade Assessment: II - A patient with mild systemic                            disease. After reviewing the risks and benefits,                            the patient was deemed in satisfactory condition to                            undergo the procedure.  After obtaining informed consent, the colonoscope                            was passed under direct vision. Throughout the                            procedure, the patient's blood pressure, pulse, and                            oxygen saturations were monitored continuously. The                            Olympus Scope SN: [id number] was introduced through                            the anus and advanced to the the  cecum, identified                            by appendiceal orifice and ileocecal valve. The                            ileocecal valve, appendiceal orifice, and rectum                            were photographed. The quality of the bowel                            preparation was excellent. The colonoscopy was                            performed without difficulty. The patient tolerated                            the procedure well. The bowel preparation used was                            SUPREP via split dose instruction. Scope In: 1:35:52 PM Scope Out: 1:48:43 PM Scope Withdrawal Time: 0 hours 9 minutes 38 seconds  Total Procedure Duration: 0 hours 12 minutes 51 seconds  Findings:                 The entire examined colon appeared normal on direct                            and retroflexion views. Complications:            No immediate complications. Estimated blood loss:                            None. Estimated Blood Loss:     Estimated blood loss: none. Impression:               - The entire examined colon is normal on direct and  retroflexion views.                           - No specimens collected. Recommendation:           - Repeat colonoscopy in 10 years for screening                            purposes.                           - Patient has a contact number available for                            emergencies. The signs and symptoms of potential                            delayed complications were discussed with the                            patient. Return to normal activities tomorrow.                            Written discharge instructions were provided to the                            patient.                           - Resume previous diet.                           - Continue present medications. Murel Arlington. Elvin Hammer, MD 08/06/2023 1:56:15 PM This report has been signed electronically.

## 2023-08-06 NOTE — Progress Notes (Signed)
 HISTORY OF PRESENT ILLNESS:  Mandy Davis is a 63 y.o. female presents for follow-up screening colonoscopy.  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   Wears contact lenses     Past Surgical History:  Procedure Laterality Date   CERVICAL CONE BIOPSY     1980s   CO2 LASER APPLICATION N/A 09/13/2021   Procedure: CO2 LASER APPLICATION;  Surgeon: Suzi Essex, MD;  Location: [facility];  Service: Gynecology;  Laterality: N/A;   CO2 LASER APPLICATION N/A 09/12/2022   Procedure: CO2 LASER APPLICATION TO VAGINA;  Surgeon: Derrel Flies, MD;  Location: [facility];  Service: Gynecology;  Laterality: N/A;   LESION REMOVAL N/A 09/13/2021   Procedure: WIDE LOCAL EXCISION;  Surgeon: Suzi Essex, MD;  Location: [facility];  Service: Gynecology;  Laterality: N/A;   MENISCUS REPAIR Left 04/2023   WISDOM TOOTH EXTRACTION      Social History Mandy Davis  reports that she has never smoked. She has never used smokeless tobacco. She reports current alcohol use of about 14.0 standard drinks of alcohol per week. She reports that she does not use drugs.  family history includes Anemia in her mother; Breast cancer in her paternal grandmother; Cancer in her maternal aunt; Colon polyps in her maternal grandfather; Emphysema in her paternal grandfather; Heart disease in her father; Pancreatic cancer in her maternal aunt.  No Known Allergies     PHYSICAL EXAMINATION: Vital signs: BP 130/81   Pulse 61   Temp 97.9 F (36.6 C)   Resp 16   Ht 5\' 10"  (1.778 m)   Wt 155 lb (70.3 kg)   LMP 03/09/2011 (Exact Date)    SpO2 100%   BMI 22.24 kg/m  General: Well-developed, well-nourished, no acute distress HEENT: Sclerae are anicteric, conjunctiva pink. Oral mucosa intact Lungs: Clear Heart: Regular Abdomen: soft, nontender, nondistended, no obvious ascites, no peritoneal signs, normal bowel sounds. No organomegaly. Extremities: No edema Psychiatric: alert and oriented x3. Cooperative     ASSESSMENT: Colon cancer screening    PLAN: Screening colonoscopy

## 2023-08-06 NOTE — Patient Instructions (Signed)
 Discharge instructions given. Normal exam. Resume previous medications. YOU HAD AN ENDOSCOPIC PROCEDURE TODAY AT THE [facility]:   Refer to the procedure report that was given to you for any specific questions about what was found during the examination.  If the procedure report does not answer your questions, please call your gastroenterologist to clarify.  If you requested that your care partner not be given the details of your procedure findings, then the procedure report has been included in a sealed envelope for you to review at your convenience later.  YOU SHOULD EXPECT: Some feelings of bloating in the abdomen. Passage of more gas than usual.  Walking can help get rid of the air that was put into your GI tract during the procedure and reduce the bloating. If you had a lower endoscopy (such as a colonoscopy or flexible sigmoidoscopy) you may notice spotting of blood in your stool or on the toilet paper. If you underwent a bowel prep for your procedure, you may not have a normal bowel movement for a few days.  Please Note:  You might notice some irritation and congestion in your nose or some drainage.  This is from the oxygen used during your procedure.  There is no need for concern and it should clear up in a day or so.  SYMPTOMS TO REPORT IMMEDIATELY:  Following lower endoscopy (colonoscopy or flexible sigmoidoscopy):  Excessive amounts of blood in the stool  Significant tenderness or worsening of abdominal pains  Swelling of the abdomen that is new, acute  Fever of 100F or higher   For urgent or emergent issues, a gastroenterologist can be reached at any hour by calling [phone]. Do not use MyChart messaging for urgent concerns.    DIET:  We do recommend a small meal at first, but then you may proceed to your regular diet.  Drink plenty of fluids but you should avoid alcoholic beverages for 24 hours.  ACTIVITY:  You should plan to take it easy for the rest of  today and you should NOT DRIVE or use heavy machinery until tomorrow (because of the sedation medicines used during the test).    FOLLOW UP: Our staff will call the number listed on your records the next business day following your procedure.  We will call around 7:15- 8:00 am to check on you and address any questions or concerns that you may have regarding the information given to you following your procedure. If we do not reach you, we will leave a message.     If any biopsies were taken you will be contacted by phone or by letter within the next 1-3 weeks.  Please call us at [phone] if you have not heard about the biopsies in 3 weeks.    SIGNATURES/CONFIDENTIALITY: You and/or your care partner have signed paperwork which will be entered into your electronic medical record.  These signatures attest to the fact that that the information above on your After Visit Summary has been reviewed and is understood.  Full responsibility of the confidentiality of this discharge information lies with you and/or your care-partner.

## 2023-08-07 ENCOUNTER — Telehealth: Payer: Self-pay | Admitting: *Deleted

## 2023-08-07 NOTE — Telephone Encounter (Signed)
 No answer on  follow up call. Left message.

## 2023-10-30 IMAGING — MG MM DIGITAL SCREENING BILAT W/ TOMO AND CAD
8 series · 9 of 24 positions shown · non-contrast
Comparison: Previous exam(s).

CLINICAL DATA: Screening.

EXAM:
DIGITAL SCREENING BILATERAL MAMMOGRAM WITH TOMOSYNTHESIS AND CAD
TECHNIQUE: Bilateral screening digital craniocaudal and mediolateral oblique
mammograms were obtained. Bilateral screening digital breast
tomosynthesis was performed. The images were evaluated with
computer-aided detection.

[L CC synth-2D]
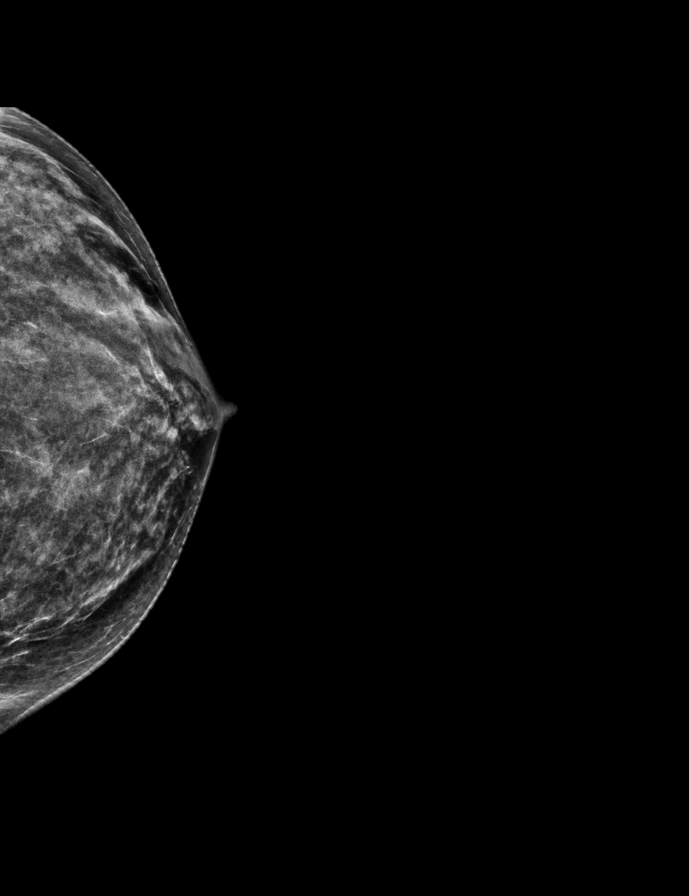

[R CC synth-2D]
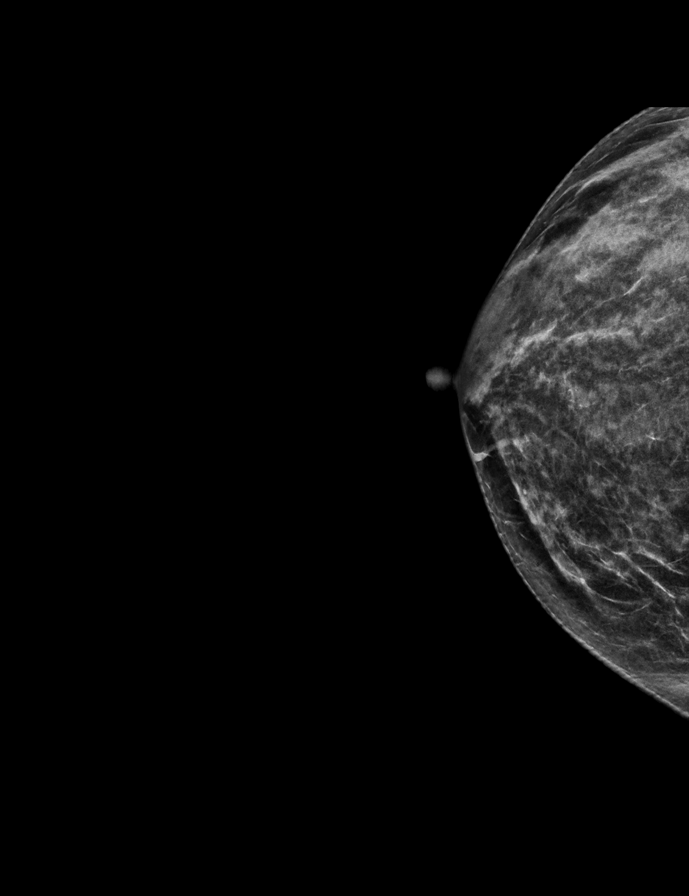

[L MLO synth-2D]
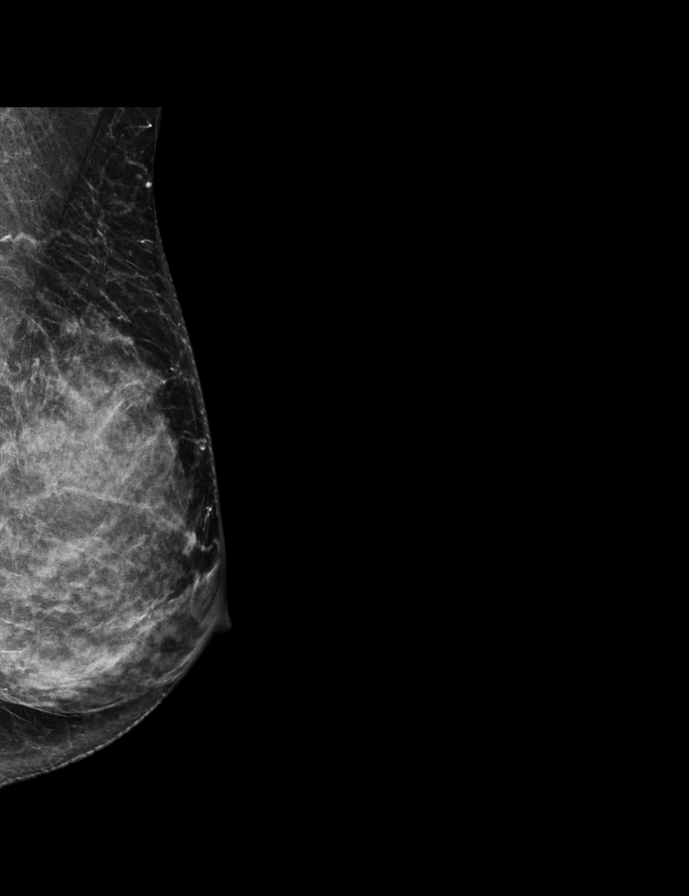

[R MLO synth-2D]
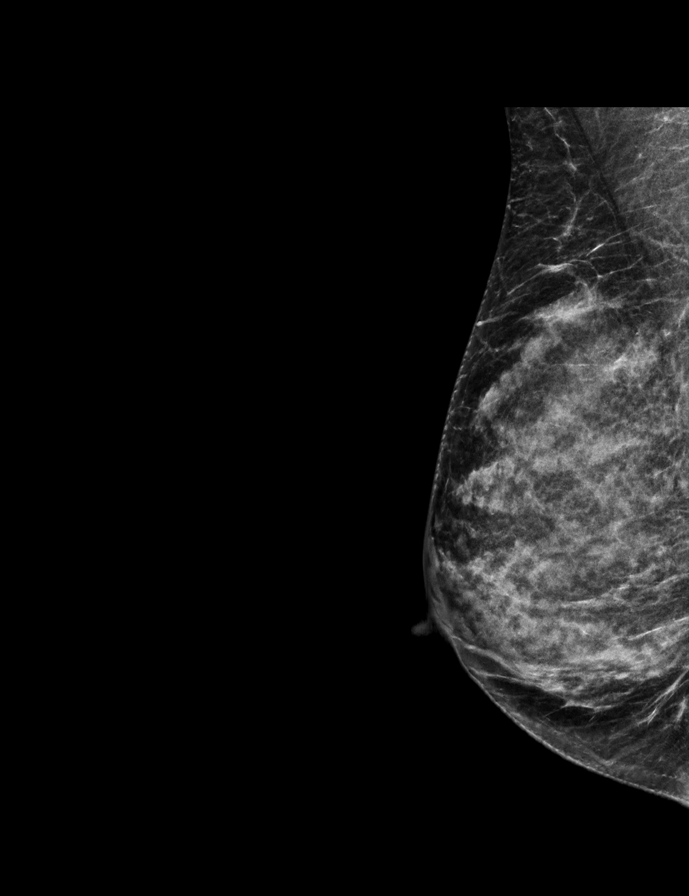

[L MLO tomo · 2 of 54 frames shown]
[frame 18/54]
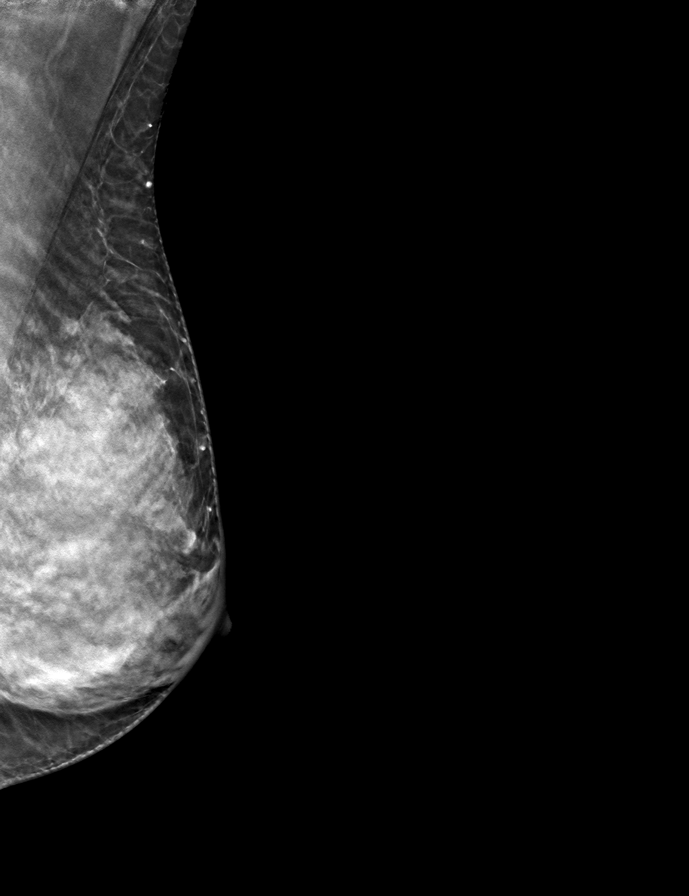
[frame 27/54]
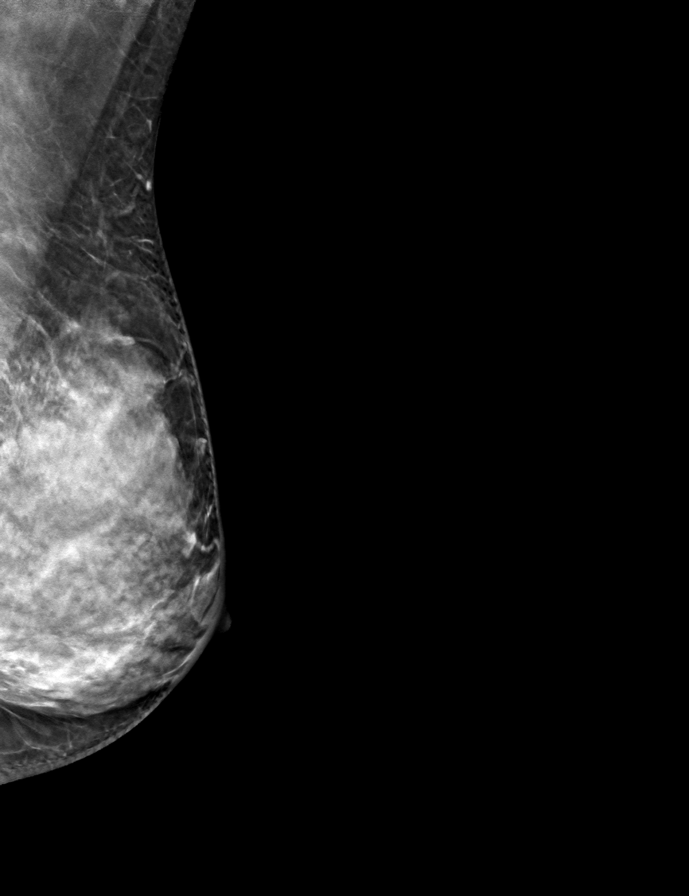

[R CC tomo · tomo slice 24/47.0]
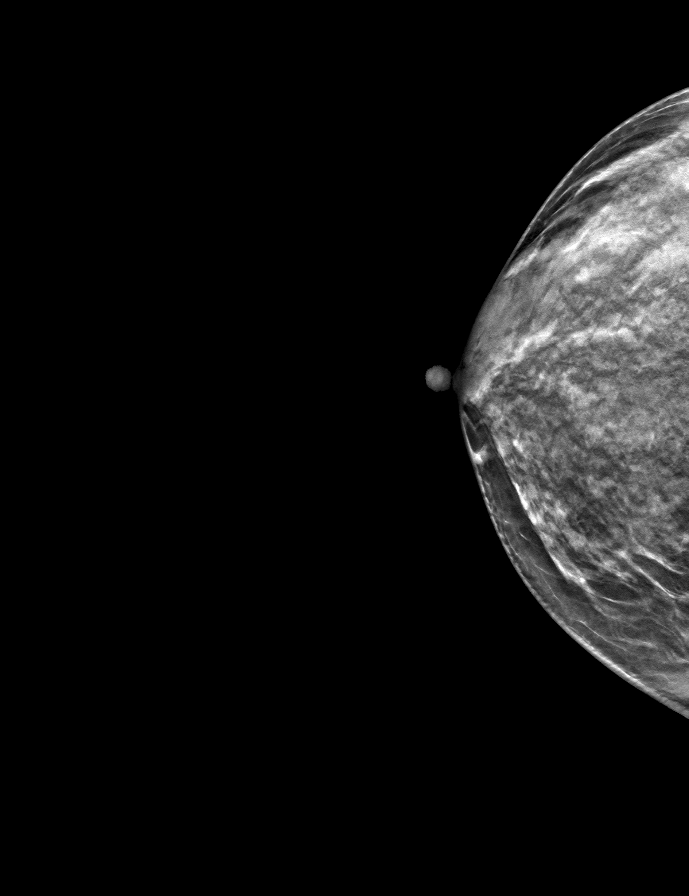

[L CC tomo · tomo slice 24/47.0]
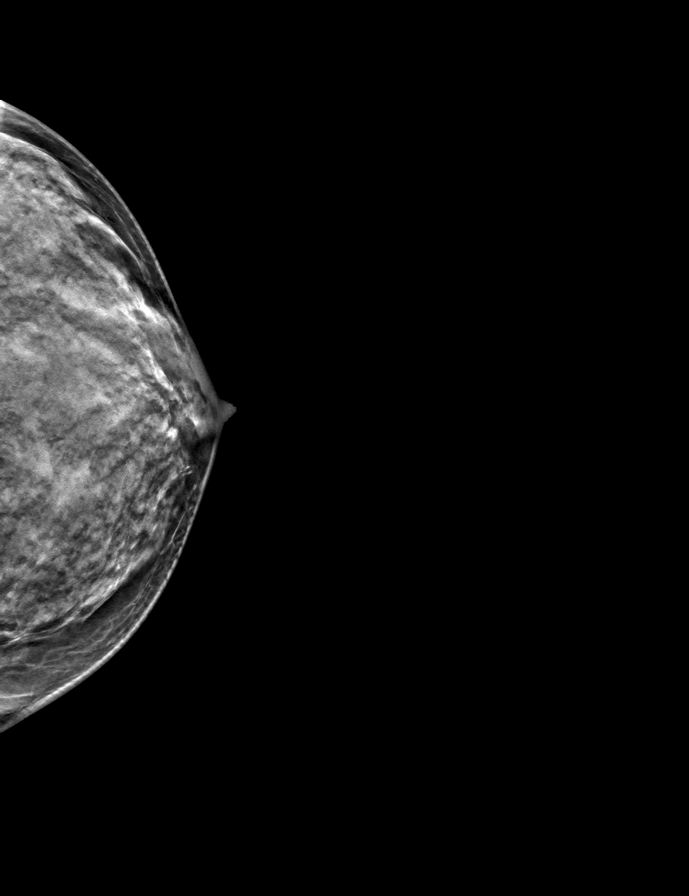

[R MLO tomo · tomo slice 27/52.0]
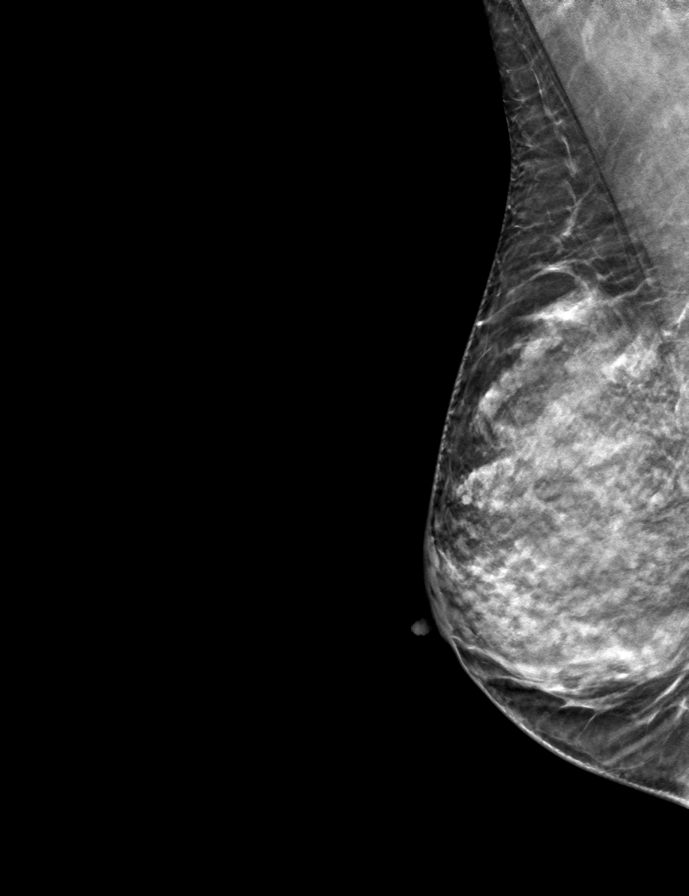

[9 of 24 positions shown; findings below may reference images not displayed]

ACR Breast Density Category d: The breast tissue is extremely dense,
which lowers the sensitivity of mammography
FINDINGS: There are no findings suspicious for malignancy.
IMPRESSION: No mammographic evidence of malignancy. A result letter of this
screening mammogram will be mailed directly to the patient.

RECOMMENDATION:
Screening mammogram in one year. (Code:TA-V-WV9)

BI-RADS CATEGORY  1: Negative.

## 2024-02-18 ENCOUNTER — Ambulatory Visit: Admitting: Obstetrics and Gynecology

## 2024-02-19 ENCOUNTER — Ambulatory Visit: Admitting: Obstetrics and Gynecology

## 2024-02-19 ENCOUNTER — Encounter: Payer: Self-pay | Admitting: Obstetrics and Gynecology

## 2024-02-19 VITALS — BP 120/64 | HR 68

## 2024-02-19 DIAGNOSIS — R195 Other fecal abnormalities: Secondary | ICD-10-CM | POA: Diagnosis not present

## 2024-02-19 DIAGNOSIS — Z87411 Personal history of vaginal dysplasia: Secondary | ICD-10-CM | POA: Diagnosis not present

## 2024-02-19 NOTE — Progress Notes (Signed)
 GYNECOLOGY  VISIT   HPI: 63 y.o.   Widowed  Caucasian female   G0P0000 with Patient's last menstrual period was 03/09/2011 (exact date).   here for: 6 month follow up - hx abnormal pap, cervical & vaginal dysplasia. Needs refill on estradiol .  Has refills on file.   No vaginal bleeding or unusual discharge.  Random shooting pain in her RLQ.  Occurs once a week.  Not related to anything.    Not sexually active.    Having a change in her bowel pattern.  Stools are very loose.   Colonoscopy was done in Spring, 2025, and it was normal.  No hx colitis.    GYNECOLOGIC HISTORY: Patient's last menstrual period was 03/09/2011 (exact date). Contraception:  PMP Menopausal hormone therapy:  Estrace   Last 2 paps:  07/16/23 neg HR HPV neg, 01/04/23 neg HR HPV neg  History of abnormal Pap or positive HPV:  yes, Cone biopsy over 30 years ago, Pap 2020-WNL, HPV+ (HPV 16, 18/45)-neg, 2023-LGSIL, HPV+, Colpo 07/25/2021-VAIN 3, PAP 07/05/2022-HGSIL, HPV+, CO2 laser w/ Onc 09/12/2022 Mammogram:  05/17/23 Breast Density Cat D, BIRADS Cat 1 neg         OB History     Gravida  0   Para  0   Term  0   Preterm  0   AB  0   Living  0      SAB  0   IAB  0   Ectopic  0   Multiple  0   Live Births  0              Patient Active Problem List   Diagnosis Date Noted   VAIN III (vaginal intraepithelial neoplasia grade III) 08/26/2021   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   Cervical dysplasia 08/26/2021   Personal history of COVID-19 06/22/2021   Baker's cyst of knee 03/24/2015   Osteoarthritis 01/08/2009    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   Wears contact lenses     Past Surgical History:  Procedure Laterality Date   CERVICAL CONE  BIOPSY     1980s   CO2 LASER APPLICATION N/A 09/13/2021   Procedure: CO2 LASER APPLICATION;  Surgeon: Viktoria Comer SAUNDERS, MD;  Location: [facility];  Service: Gynecology;  Laterality: N/A;   CO2 LASER APPLICATION N/A 09/12/2022   Procedure: CO2 LASER APPLICATION TO VAGINA;  Surgeon: Eldonna Mays, MD;  Location: [facility];  Service: Gynecology;  Laterality: N/A;   LESION REMOVAL N/A 09/13/2021   Procedure: WIDE LOCAL EXCISION;  Surgeon: Viktoria Comer SAUNDERS, MD;  Location: [facility];  Service: Gynecology;  Laterality: N/A;   MENISCUS REPAIR Left 04/2023   WISDOM TOOTH EXTRACTION      Current Outpatient Medications  Medication Sig Dispense Refill   estradiol  (ESTRACE  VAGINAL) 0.1 MG/GM vaginal cream Place 1/2 gram per vagina at hs twice a week. 42.5 g 2   naproxen sodium (ALEVE) 220 MG tablet Take 220 mg by mouth 2 (two) times daily as needed (pain).     Polyethyl Glycol-Propyl Glycol (SYSTANE OP) Place 1 drop into both eyes 2 (two) times daily.     No current facility-administered  medications for this visit.     ALLERGIES: Patient has no known allergies.  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   Social Drivers of Corporate Investment Banker Strain: Not on file  Food Insecurity: Not on file  Transportation Needs: Not on file  Physical Activity: Not on file  Stress: Not on file  Social Connections: Not on file  Intimate Partner Violence: Not on file    Review of Systems  All other systems reviewed and are negative.   PHYSICAL EXAMINATION:   BP 120/64 (BP Location: Left Arm, Patient Position: Sitting)   Pulse 68   LMP 03/09/2011 (Exact Date)   SpO2 99%     General appearance: alert, cooperative and appears stated age   Abdomen: soft, non-tender, no masses,  no organomegaly   Pelvic: External genitalia:  no lesions              Urethra:  normal appearing urethra with no masses, tenderness or lesions              Bartholins and Skenes: normal                 Vagina: normal appearing vagina with normal color and discharge, no lesions              Cervix: no lesions                Bimanual Exam:  Uterus:  normal size, contour, position, consistency, mobility, non-tender              Adnexa: no mass, fullness, tenderness      Chaperone was present for exam:  Kari HERO, CMA  ASSESSMENT:  Hx recurrent VAIN III.  No sign of recurrence today. Status post CO2 laser ablation of VAIN II/III 09/13/21. Status post CO2 laser ablation of VAIN III 09/12/22.  Hx cone biopsy over 30 years ago.  Hx CIN I. Vaginal atrophy.  Encounter for medication monitoring.  Loose bowel movements.  No acute abdomen.   PLAN:  Continue 6 month surveillance for dysplasia.  Annual exam in 6 months with  pap and HR  HPV testing.  Continue vaginal estradiol . Consider metamucil to treat change in bowel consistency. Follow up with PCP if bowel change and RLQ pain persist.   25 min  total time was spent for this patient encounter, including preparation, face-to-face counseling with the patient, coordination of care, and documentation of the encounter.

## 2024-04-04 ENCOUNTER — Other Ambulatory Visit: Payer: Self-pay | Admitting: Internal Medicine

## 2024-04-04 DIAGNOSIS — Z1231 Encounter for screening mammogram for malignant neoplasm of breast: Secondary | ICD-10-CM

## 2024-05-20 ENCOUNTER — Ambulatory Visit

## 2024-07-16 ENCOUNTER — Ambulatory Visit: Admitting: Obstetrics and Gynecology

## 2024-08-27 ENCOUNTER — Ambulatory Visit: Admitting: Obstetrics and Gynecology
